# Patient Record
Sex: Female | Born: 1955
Health system: Southern US, Community
[De-identification: ages and names within clinical notes are randomized; demographics above are authoritative.]

## PROBLEM LIST (undated history)

## (undated) DIAGNOSIS — M2550 Pain in unspecified joint: Secondary | ICD-10-CM

## (undated) DIAGNOSIS — R002 Palpitations: Secondary | ICD-10-CM

## (undated) DIAGNOSIS — I10 Essential (primary) hypertension: Secondary | ICD-10-CM

## (undated) DIAGNOSIS — D3130 Benign neoplasm of unspecified choroid: Secondary | ICD-10-CM

## (undated) DIAGNOSIS — R011 Cardiac murmur, unspecified: Secondary | ICD-10-CM

## (undated) DIAGNOSIS — M069 Rheumatoid arthritis, unspecified: Secondary | ICD-10-CM

## (undated) DIAGNOSIS — R5383 Other fatigue: Secondary | ICD-10-CM

## (undated) DIAGNOSIS — E039 Hypothyroidism, unspecified: Secondary | ICD-10-CM

## (undated) DIAGNOSIS — F4320 Adjustment disorder, unspecified: Secondary | ICD-10-CM

## (undated) DIAGNOSIS — G8929 Other chronic pain: Secondary | ICD-10-CM

## (undated) DIAGNOSIS — G4733 Obstructive sleep apnea (adult) (pediatric): Secondary | ICD-10-CM

## (undated) DIAGNOSIS — F329 Major depressive disorder, single episode, unspecified: Secondary | ICD-10-CM

## (undated) DIAGNOSIS — I341 Nonrheumatic mitral (valve) prolapse: Secondary | ICD-10-CM

## (undated) DIAGNOSIS — Z13220 Encounter for screening for lipoid disorders: Secondary | ICD-10-CM

## (undated) HISTORY — DX: Pain in unspecified joint: M25.50

## (undated) HISTORY — DX: Rheumatoid arthritis, unspecified: M06.9

## (undated) HISTORY — PX: CHOLECYSTECTOMY: SHX55

## (undated) HISTORY — DX: Benign neoplasm of unspecified choroid: D31.30

## (undated) HISTORY — DX: Other chronic pain: G89.29

## (undated) HISTORY — DX: Cardiac murmur, unspecified: R01.1

## (undated) HISTORY — DX: Palpitations: R00.2

## (undated) HISTORY — DX: Obstructive sleep apnea (adult) (pediatric): G47.33

## (undated) HISTORY — DX: Essential (primary) hypertension: I10

## (undated) HISTORY — DX: Hypothyroidism, unspecified: E03.9

## (undated) HISTORY — DX: Nonrheumatic mitral (valve) prolapse: I34.1

## (undated) HISTORY — DX: Encounter for screening for lipoid disorders: Z13.220

## (undated) HISTORY — DX: Other fatigue: R53.83

## (undated) HISTORY — DX: Major depressive disorder, single episode, unspecified: F32.9

## (undated) HISTORY — DX: Adjustment disorder, unspecified: F43.20

## (undated) HISTORY — PX: BACK SURGERY: SHX140

---

## 1999-03-22 ENCOUNTER — Encounter: Payer: Self-pay | Admitting: Neurosurgery

## 1999-03-22 ENCOUNTER — Inpatient Hospital Stay (HOSPITAL_COMMUNITY): Admission: RE | Admit: 1999-03-22 | Discharge: 1999-03-23 | Payer: Self-pay | Admitting: Neurosurgery

## 1999-08-20 ENCOUNTER — Encounter: Payer: Self-pay | Admitting: Neurosurgery

## 1999-08-20 ENCOUNTER — Encounter: Admission: RE | Admit: 1999-08-20 | Discharge: 1999-08-20 | Payer: Self-pay | Admitting: Gynecology

## 2010-02-03 ENCOUNTER — Encounter: Payer: Self-pay | Admitting: Family Medicine

## 2011-05-27 DIAGNOSIS — D3130 Benign neoplasm of unspecified choroid: Secondary | ICD-10-CM

## 2011-05-27 HISTORY — DX: Benign neoplasm of unspecified choroid: D31.30

## 2013-08-03 ENCOUNTER — Ambulatory Visit: Payer: Self-pay | Admitting: Podiatrist

## 2014-10-02 DIAGNOSIS — R002 Palpitations: Secondary | ICD-10-CM

## 2014-10-02 HISTORY — DX: Palpitations: R00.2

## 2014-11-30 ENCOUNTER — Other Ambulatory Visit: Payer: Self-pay | Admitting: Internal Medicine

## 2014-11-30 DIAGNOSIS — M549 Dorsalgia, unspecified: Secondary | ICD-10-CM

## 2015-10-13 DIAGNOSIS — G8929 Other chronic pain: Secondary | ICD-10-CM | POA: Insufficient documentation

## 2015-10-13 DIAGNOSIS — M2550 Pain in unspecified joint: Secondary | ICD-10-CM

## 2015-10-13 HISTORY — DX: Other chronic pain: G89.29

## 2015-10-14 DIAGNOSIS — G4733 Obstructive sleep apnea (adult) (pediatric): Secondary | ICD-10-CM

## 2015-10-14 DIAGNOSIS — I341 Nonrheumatic mitral (valve) prolapse: Secondary | ICD-10-CM

## 2015-10-14 DIAGNOSIS — F32A Depression, unspecified: Secondary | ICD-10-CM

## 2015-10-14 DIAGNOSIS — E039 Hypothyroidism, unspecified: Secondary | ICD-10-CM | POA: Insufficient documentation

## 2015-10-14 DIAGNOSIS — F329 Major depressive disorder, single episode, unspecified: Secondary | ICD-10-CM | POA: Insufficient documentation

## 2015-10-14 HISTORY — DX: Hypothyroidism, unspecified: E03.9

## 2015-10-14 HISTORY — DX: Nonrheumatic mitral (valve) prolapse: I34.1

## 2015-10-14 HISTORY — DX: Obstructive sleep apnea (adult) (pediatric): G47.33

## 2015-10-14 HISTORY — DX: Depression, unspecified: F32.A

## 2016-01-17 DIAGNOSIS — R0989 Other specified symptoms and signs involving the circulatory and respiratory systems: Secondary | ICD-10-CM | POA: Diagnosis not present

## 2016-01-17 DIAGNOSIS — R0689 Other abnormalities of breathing: Secondary | ICD-10-CM | POA: Diagnosis not present

## 2016-01-17 DIAGNOSIS — J029 Acute pharyngitis, unspecified: Secondary | ICD-10-CM | POA: Diagnosis not present

## 2016-01-17 DIAGNOSIS — R51 Headache: Secondary | ICD-10-CM | POA: Diagnosis not present

## 2016-01-17 DIAGNOSIS — J22 Unspecified acute lower respiratory infection: Secondary | ICD-10-CM | POA: Diagnosis not present

## 2016-01-17 DIAGNOSIS — R05 Cough: Secondary | ICD-10-CM | POA: Diagnosis not present

## 2016-04-26 DIAGNOSIS — Z23 Encounter for immunization: Secondary | ICD-10-CM | POA: Diagnosis not present

## 2016-04-26 DIAGNOSIS — Z13220 Encounter for screening for lipoid disorders: Secondary | ICD-10-CM | POA: Insufficient documentation

## 2016-04-26 DIAGNOSIS — I10 Essential (primary) hypertension: Secondary | ICD-10-CM

## 2016-04-26 DIAGNOSIS — E039 Hypothyroidism, unspecified: Secondary | ICD-10-CM | POA: Diagnosis not present

## 2016-04-26 DIAGNOSIS — M2550 Pain in unspecified joint: Secondary | ICD-10-CM | POA: Diagnosis not present

## 2016-04-26 DIAGNOSIS — F4320 Adjustment disorder, unspecified: Secondary | ICD-10-CM

## 2016-04-26 DIAGNOSIS — Z0000 Encounter for general adult medical examination without abnormal findings: Secondary | ICD-10-CM | POA: Diagnosis not present

## 2016-04-26 HISTORY — DX: Essential (primary) hypertension: I10

## 2016-04-26 HISTORY — DX: Encounter for screening for lipoid disorders: Z13.220

## 2016-04-26 HISTORY — DX: Adjustment disorder, unspecified: F43.20

## 2016-06-20 DIAGNOSIS — G8929 Other chronic pain: Secondary | ICD-10-CM | POA: Diagnosis not present

## 2016-06-20 DIAGNOSIS — M2550 Pain in unspecified joint: Secondary | ICD-10-CM | POA: Diagnosis not present

## 2016-08-01 DIAGNOSIS — L820 Inflamed seborrheic keratosis: Secondary | ICD-10-CM | POA: Diagnosis not present

## 2016-08-01 DIAGNOSIS — L578 Other skin changes due to chronic exposure to nonionizing radiation: Secondary | ICD-10-CM | POA: Diagnosis not present

## 2016-08-01 DIAGNOSIS — L821 Other seborrheic keratosis: Secondary | ICD-10-CM | POA: Diagnosis not present

## 2016-08-01 DIAGNOSIS — C44629 Squamous cell carcinoma of skin of left upper limb, including shoulder: Secondary | ICD-10-CM | POA: Diagnosis not present

## 2016-09-23 DIAGNOSIS — R011 Cardiac murmur, unspecified: Secondary | ICD-10-CM

## 2016-09-23 DIAGNOSIS — M069 Rheumatoid arthritis, unspecified: Secondary | ICD-10-CM

## 2016-09-23 DIAGNOSIS — R5383 Other fatigue: Secondary | ICD-10-CM

## 2016-09-23 HISTORY — DX: Other fatigue: R53.83

## 2016-09-23 HISTORY — DX: Cardiac murmur, unspecified: R01.1

## 2016-09-23 HISTORY — DX: Rheumatoid arthritis, unspecified: M06.9

## 2016-09-27 ENCOUNTER — Ambulatory Visit: Payer: Self-pay | Admitting: Cardiology

## 2016-10-10 NOTE — Progress Notes (Signed)
Cardiology Office Note:    Date:  10/11/2016   ID:  Shelby Kirk, DOB 1955/12/18, MRN [id number]  PCP: Dr Theda Sers  Cardiologist:  Shirlee More, MD    Referring MD: Commodore He*    ASSESSMENT:    1. Palpitation   2. Essential hypertension   3. MVP (mitral valve prolapse)    PLAN:    In order of problems listed above:  1. Stable continue her current beta blocker 2.          Blood pressure is not at target, I asked her to resume her loop diuretic 2 days a week and monitor home blood pressure 3.          Stable no murmur on exam I don't think she requires repeat cardiac imaging  Next appointment:  months   Medication Adjustments/Labs and Tests Ordered: Current medicines are reviewed at length with the patient today.  Concerns regarding medicines are outlined above.  Orders Placed This Encounter  Procedures  . EKG 12-Lead   Meds ordered this encounter  Medications  . furosemide (LASIX) 20 MG tablet    Sig: Take 1 tablet (20 mg total) by mouth 2 (two) times a week.    Dispense:  30 tablet    Refill:  6    Chief Complaint  Patient presents with  . Follow-up    1 year follow up   . Edema  . Palpitations    History of Present Illness:    Shelby Kirk is a 60 y.o. female with a hx of Palpitations with symptomatic APC's last seen within the last year. She is under intense stress with the death of her mother and depression and has stopped her antidepressant medication. She has episodes at rest and under stress which she is aware of her heart beating but not severe or sustained. When she's checked her heart rate is less than 100 bpm. No complaints of chest pain shortness of breath or syncope.. Compliance with diet, lifestyle and medications: no, she stopped her antihypertensive diureticand antidepressant Past Medical History:  Diagnosis Date  . Adult situational stress disorder 04/26/2016  . Benign neoplasm of choroid 05/27/2011  . Chronic pain of  multiple joints 10/13/2015  . Depression 10/14/2015  . Essential hypertension 04/26/2016  . Fatigue 09/23/2016  . Hypothyroidism 10/14/2015  . Murmur, heart 09/23/2016  . MVP (mitral valve prolapse) 10/14/2015  . OSA (obstructive sleep apnea) 10/14/2015  . Palpitation 10/02/2014  . Rheumatoid arthritis ([place]) 09/23/2016  . Screening cholesterol level 04/26/2016    Past Surgical History:  Procedure Laterality Date  . BACK SURGERY    . CESAREAN SECTION    . CHOLECYSTECTOMY      Current Medications: Current Meds  Medication Sig  . acebutolol (SECTRAL) 200 MG capsule Take 200 mg by mouth 2 (two) times daily.  . cyclobenzaprine (FLEXERIL) 10 MG tablet Take 10 mg by mouth daily as needed for muscle spasms.  Derrill Memo ON 10/14/2016] furosemide (LASIX) 20 MG tablet Take 1 tablet (20 mg total) by mouth 2 (two) times a week.  Marland Kitchen ibuprofen (ADVIL,MOTRIN) 800 MG tablet TAKE ONE TABLET BY MOUTH THREE TIMES DAILY WITH FOOD OR MILK FOR JOINT PAIN  . levothyroxine (SYNTHROID, LEVOTHROID) 112 MCG tablet Take 112 mcg by mouth daily.  Marland Kitchen lisinopril (PRINIVIL,ZESTRIL) 40 MG tablet Take 40 mg by mouth daily.  . [DISCONTINUED] furosemide (LASIX) 20 MG tablet TAKE 2 TABLETS BY MOUTH ONCE PER DAY ONLY AS NEEDED FOR LEG SWELLING  Allergies:   Penicillins and Sulfa antibiotics   Social History   Social History  . Marital status: Married    Spouse name: N/A  . Number of children: N/A  . Years of education: N/A   Social History Main Topics  . Smoking status: Never Smoker  . Smokeless tobacco: Never Used  . Alcohol use No  . Drug use: No  . Sexual activity: Not Asked   Other Topics Concern  . None   Social History Narrative  . None     Family History: The patient's family history includes Bone cancer in her father; CAD in her father and mother; Heart attack in her maternal grandfather and maternal grandmother; Hyperlipidemia in her father and mother; Hypertension in her father and mother; Stroke in  her maternal grandmother. ROS:   Please see the history of present illness.    All other systems reviewed and are negative.  EKGs/Labs/Other Studies Reviewed:    The following studies were reviewed today:  EKG:  EKG ordered today.  The ekg ordered today demonstrates inus rhythm normal  Recent Labs: No results found for requested labs within last 8760 hours.  Recent Lipid Panel No results found for: CHOL, TRIG, HDL, CHOLHDL, VLDL, LDLCALC, LDLDIRECT  Physical Exam:    VS:  BP (!) 136/96 (BP Location: Right Arm, Patient Position: Sitting)   Pulse 60   Ht 5\' 7"  (1.702 m)   Wt 238 lb (108 kg)   SpO2 95%   BMI 37.28 kg/m     Wt Readings from Last 3 Encounters:  10/11/16 238 lb (108 kg)     GEN:  Well nourished, well developed in no acute distress HEENT: Normal NECK: No JVD; No carotid bruits LYMPHATICS: No lymphadenopathy CARDIAC: RRR, no murmurs, rubs, gallops RESPIRATORY:  Clear to auscultation without rales, wheezing or rhonchi  ABDOMEN: Soft, non-tender, non-distended MUSCULOSKELETAL:  No edema; No deformity  SKIN: Warm and dry NEUROLOGIC:  Alert and oriented x 3 PSYCHIATRIC:  Normal affect    Signed, Shirlee More, MD  10/11/2016 12:32 PM    [facility]

## 2016-10-11 ENCOUNTER — Encounter: Payer: Self-pay | Admitting: Cardiology

## 2016-10-11 ENCOUNTER — Ambulatory Visit (INDEPENDENT_AMBULATORY_CARE_PROVIDER_SITE_OTHER): Payer: 59 | Admitting: Cardiology

## 2016-10-11 VITALS — BP 136/96 | HR 60 | Ht 67.0 in | Wt 238.0 lb

## 2016-10-11 DIAGNOSIS — R002 Palpitations: Secondary | ICD-10-CM | POA: Diagnosis not present

## 2016-10-11 DIAGNOSIS — I341 Nonrheumatic mitral (valve) prolapse: Secondary | ICD-10-CM

## 2016-10-11 DIAGNOSIS — I10 Essential (primary) hypertension: Secondary | ICD-10-CM

## 2016-10-11 MED ORDER — FUROSEMIDE 20 MG PO TABS: 20.0000 mg | ORAL_TABLET | ORAL | 6 refills | Status: DC

## 2016-10-11 NOTE — Patient Instructions (Addendum)
Medication Instructions:  Your physician has recommended you make the following change in your medication:  START furosemide (Lasix) 20 mg 2 days a week  Labwork: None  Testing/Procedures: You had an EKG today.  Follow-Up: Your physician wants you to follow-up in: 6 months. You will receive a reminder letter in the mail two months in advance. If you don't receive a letter, please call our office to schedule the follow-up appointment.   Any Other Special Instructions Will Be Listed Below (If Applicable).     If you need a refill on your cardiac medications before your next appointment, please call your pharmacy.    1. Avoid all over-the-counter antihistamines except Claritin/Loratadine and Zyrtec/Cetrizine. 2. Avoid all combination including cold sinus allergies flu decongestant and sleep medications 3. You can use Robitussin DM Mucinex and Mucinex DM for cough. 4. can use Tylenol aspirin ibuprofen and naproxen but no combinations such as sleep or sinus.

## 2016-11-19 ENCOUNTER — Other Ambulatory Visit: Payer: Self-pay

## 2016-11-19 MED ORDER — ACEBUTOLOL HCL 200 MG PO CAPS: 200.0000 mg | ORAL_CAPSULE | Freq: Two times a day (BID) | ORAL | 3 refills | Status: DC

## 2016-11-21 DIAGNOSIS — Z23 Encounter for immunization: Secondary | ICD-10-CM | POA: Diagnosis not present

## 2016-12-11 DIAGNOSIS — M2550 Pain in unspecified joint: Secondary | ICD-10-CM | POA: Diagnosis not present

## 2016-12-11 DIAGNOSIS — G8929 Other chronic pain: Secondary | ICD-10-CM | POA: Diagnosis not present

## 2017-01-24 DIAGNOSIS — M2550 Pain in unspecified joint: Secondary | ICD-10-CM | POA: Diagnosis not present

## 2017-01-24 DIAGNOSIS — G8929 Other chronic pain: Secondary | ICD-10-CM | POA: Diagnosis not present

## 2017-01-31 DIAGNOSIS — B372 Candidiasis of skin and nail: Secondary | ICD-10-CM | POA: Diagnosis not present

## 2017-01-31 DIAGNOSIS — G4730 Sleep apnea, unspecified: Secondary | ICD-10-CM | POA: Diagnosis not present

## 2017-02-20 DIAGNOSIS — Z780 Asymptomatic menopausal state: Secondary | ICD-10-CM | POA: Diagnosis not present

## 2017-03-06 DIAGNOSIS — I10 Essential (primary) hypertension: Secondary | ICD-10-CM | POA: Diagnosis not present

## 2017-03-06 DIAGNOSIS — M79606 Pain in leg, unspecified: Secondary | ICD-10-CM | POA: Diagnosis not present

## 2017-04-25 DIAGNOSIS — G8929 Other chronic pain: Secondary | ICD-10-CM | POA: Diagnosis not present

## 2017-04-25 DIAGNOSIS — M2550 Pain in unspecified joint: Secondary | ICD-10-CM | POA: Diagnosis not present

## 2017-08-19 DIAGNOSIS — M170 Bilateral primary osteoarthritis of knee: Secondary | ICD-10-CM | POA: Diagnosis not present

## 2017-08-19 DIAGNOSIS — G8929 Other chronic pain: Secondary | ICD-10-CM | POA: Diagnosis not present

## 2017-08-19 DIAGNOSIS — M25561 Pain in right knee: Secondary | ICD-10-CM | POA: Diagnosis not present

## 2017-08-19 DIAGNOSIS — M25562 Pain in left knee: Secondary | ICD-10-CM | POA: Diagnosis not present

## 2017-08-22 ENCOUNTER — Other Ambulatory Visit: Payer: Self-pay | Admitting: Orthopedic Surgery

## 2017-08-22 DIAGNOSIS — R531 Weakness: Secondary | ICD-10-CM

## 2017-08-22 DIAGNOSIS — R52 Pain, unspecified: Secondary | ICD-10-CM

## 2017-09-03 DIAGNOSIS — E039 Hypothyroidism, unspecified: Secondary | ICD-10-CM | POA: Diagnosis not present

## 2017-09-03 DIAGNOSIS — Z780 Asymptomatic menopausal state: Secondary | ICD-10-CM | POA: Diagnosis not present

## 2017-09-03 DIAGNOSIS — I10 Essential (primary) hypertension: Secondary | ICD-10-CM | POA: Diagnosis not present

## 2017-09-30 DIAGNOSIS — M461 Sacroiliitis, not elsewhere classified: Secondary | ICD-10-CM | POA: Diagnosis not present

## 2017-09-30 DIAGNOSIS — M542 Cervicalgia: Secondary | ICD-10-CM | POA: Diagnosis not present

## 2017-09-30 DIAGNOSIS — M545 Low back pain: Secondary | ICD-10-CM | POA: Diagnosis not present

## 2017-10-07 DIAGNOSIS — M545 Low back pain: Secondary | ICD-10-CM | POA: Diagnosis not present

## 2017-10-07 DIAGNOSIS — M461 Sacroiliitis, not elsewhere classified: Secondary | ICD-10-CM | POA: Diagnosis not present

## 2017-10-07 DIAGNOSIS — M542 Cervicalgia: Secondary | ICD-10-CM | POA: Diagnosis not present

## 2017-11-13 DIAGNOSIS — Z6837 Body mass index (BMI) 37.0-37.9, adult: Secondary | ICD-10-CM | POA: Diagnosis not present

## 2017-11-13 DIAGNOSIS — Z1159 Encounter for screening for other viral diseases: Secondary | ICD-10-CM | POA: Diagnosis not present

## 2017-11-13 DIAGNOSIS — M1990 Unspecified osteoarthritis, unspecified site: Secondary | ICD-10-CM | POA: Diagnosis not present

## 2017-11-13 DIAGNOSIS — Z23 Encounter for immunization: Secondary | ICD-10-CM | POA: Diagnosis not present

## 2018-02-06 DIAGNOSIS — G4733 Obstructive sleep apnea (adult) (pediatric): Secondary | ICD-10-CM | POA: Diagnosis not present

## 2018-02-09 DIAGNOSIS — M130 Polyarthritis, unspecified: Secondary | ICD-10-CM | POA: Diagnosis not present

## 2018-02-09 DIAGNOSIS — Z6838 Body mass index (BMI) 38.0-38.9, adult: Secondary | ICD-10-CM | POA: Diagnosis not present

## 2018-02-09 DIAGNOSIS — R748 Abnormal levels of other serum enzymes: Secondary | ICD-10-CM | POA: Diagnosis not present

## 2018-02-10 DIAGNOSIS — G4733 Obstructive sleep apnea (adult) (pediatric): Secondary | ICD-10-CM | POA: Diagnosis not present

## 2018-02-16 DIAGNOSIS — K760 Fatty (change of) liver, not elsewhere classified: Secondary | ICD-10-CM | POA: Diagnosis not present

## 2018-02-16 DIAGNOSIS — R748 Abnormal levels of other serum enzymes: Secondary | ICD-10-CM | POA: Diagnosis not present

## 2018-02-23 DIAGNOSIS — G4733 Obstructive sleep apnea (adult) (pediatric): Secondary | ICD-10-CM | POA: Diagnosis not present

## 2018-03-09 DIAGNOSIS — G4733 Obstructive sleep apnea (adult) (pediatric): Secondary | ICD-10-CM | POA: Diagnosis not present

## 2018-04-06 DIAGNOSIS — M1990 Unspecified osteoarthritis, unspecified site: Secondary | ICD-10-CM | POA: Diagnosis not present

## 2018-04-07 DIAGNOSIS — G4733 Obstructive sleep apnea (adult) (pediatric): Secondary | ICD-10-CM | POA: Diagnosis not present

## 2018-05-21 DIAGNOSIS — M542 Cervicalgia: Secondary | ICD-10-CM | POA: Diagnosis not present

## 2018-05-21 DIAGNOSIS — M546 Pain in thoracic spine: Secondary | ICD-10-CM | POA: Diagnosis not present

## 2018-05-21 DIAGNOSIS — M545 Low back pain: Secondary | ICD-10-CM | POA: Diagnosis not present

## 2018-08-25 ENCOUNTER — Other Ambulatory Visit: Payer: Self-pay | Admitting: Cardiology

## 2019-05-06 DIAGNOSIS — R5381 Other malaise: Secondary | ICD-10-CM | POA: Insufficient documentation

## 2019-05-06 DIAGNOSIS — N951 Menopausal and female climacteric states: Secondary | ICD-10-CM

## 2019-05-06 HISTORY — DX: Menopausal and female climacteric states: N95.1

## 2019-05-06 HISTORY — DX: Other malaise: R53.81

## 2019-07-26 ENCOUNTER — Emergency Department (HOSPITAL_BASED_OUTPATIENT_CLINIC_OR_DEPARTMENT_OTHER)
Admission: EM | Admit: 2019-07-26 | Discharge: 2019-07-26 | Disposition: A | Discharge status: Home / Self Care | Payer: 59 | Attending: Emergency Medicine | Admitting: Emergency Medicine

## 2019-07-26 ENCOUNTER — Other Ambulatory Visit: Payer: Self-pay

## 2019-07-26 ENCOUNTER — Emergency Department (HOSPITAL_BASED_OUTPATIENT_CLINIC_OR_DEPARTMENT_OTHER): Payer: 59

## 2019-07-26 ENCOUNTER — Encounter (HOSPITAL_BASED_OUTPATIENT_CLINIC_OR_DEPARTMENT_OTHER): Payer: Self-pay | Admitting: Emergency Medicine

## 2019-07-26 DIAGNOSIS — Z79899 Other long term (current) drug therapy: Secondary | ICD-10-CM | POA: Insufficient documentation

## 2019-07-26 DIAGNOSIS — E039 Hypothyroidism, unspecified: Secondary | ICD-10-CM | POA: Insufficient documentation

## 2019-07-26 DIAGNOSIS — R002 Palpitations: Secondary | ICD-10-CM | POA: Diagnosis not present

## 2019-07-26 DIAGNOSIS — I10 Essential (primary) hypertension: Secondary | ICD-10-CM | POA: Diagnosis not present

## 2019-07-26 LAB — TROPONIN I (HIGH SENSITIVITY): Troponin I (High Sensitivity): 4 ng/L (ref <18)

## 2019-07-26 LAB — CBC WITH DIFFERENTIAL/PLATELET
Abs Immature Granulocytes: 0.03 10*3/uL (ref 0.00–0.07)
Basophils Absolute: 0 10*3/uL (ref 0.0–0.1)
Basophils Relative: 0 %
Eosinophils Absolute: 0.1 10*3/uL (ref 0.0–0.5)
Eosinophils Relative: 1 %
HCT: 52.8 % — ABNORMAL HIGH (ref 36.0–46.0)
Hemoglobin: 17.6 g/dL — ABNORMAL HIGH (ref 12.0–15.0)
Immature Granulocytes: 0 %
Lymphocytes Relative: 12 %
Lymphs Abs: 1.1 10*3/uL (ref 0.7–4.0)
MCH: 31.3 pg (ref 26.0–34.0)
MCHC: 33.3 g/dL (ref 30.0–36.0)
MCV: 94 fL (ref 80.0–100.0)
Monocytes Absolute: 0.5 10*3/uL (ref 0.1–1.0)
Monocytes Relative: 6 %
Neutro Abs: 7.8 10*3/uL — ABNORMAL HIGH (ref 1.7–7.7)
Neutrophils Relative %: 81 %
Platelets: 227 10*3/uL (ref 150–400)
RBC: 5.62 MIL/uL — ABNORMAL HIGH (ref 3.87–5.11)
RDW: 12.9 % (ref 11.5–15.5)
WBC: 9.7 10*3/uL (ref 4.0–10.5)
nRBC: 0 % (ref 0.0–0.2)

## 2019-07-26 LAB — BASIC METABOLIC PANEL
Anion gap: 13 (ref 5–15)
BUN: 14 mg/dL (ref 8–23)
CO2: 26 mmol/L (ref 22–32)
Calcium: 9.5 mg/dL (ref 8.9–10.3)
Chloride: 103 mmol/L (ref 98–111)
Creatinine, Ser: 0.84 mg/dL (ref 0.44–1.00)
GFR calc Af Amer: >60 mL/min (ref >60)
GFR calc non Af Amer: >60 mL/min (ref >60)
Glucose, Bld: 106 mg/dL — ABNORMAL HIGH (ref 70–99)
Potassium: 4.2 mmol/L (ref 3.5–5.1)
Sodium: 142 mmol/L (ref 135–145)

## 2019-07-26 LAB — MAGNESIUM: Magnesium: 2.1 mg/dL (ref 1.7–2.4)

## 2019-07-26 LAB — D-DIMER, QUANTITATIVE: D-Dimer, Quant: 0.34 ug/mL-FEU (ref 0.00–0.50)

## 2019-07-26 MED ORDER — ACEBUTOLOL HCL 200 MG PO CAPS: 200.0000 mg | ORAL_CAPSULE | Freq: Every day | ORAL | 0 refills | Status: DC

## 2019-07-26 NOTE — ED Notes (Signed)
ED Provider at bedside discussing test results and plan of care. 

## 2019-07-26 NOTE — ED Provider Notes (Signed)
[place] EMERGENCY DEPARTMENT Provider Note   CSN: [id number] Arrival date & time: 07/26/19  1040     History Chief Complaint  Patient presents with  . Hypertension    Shelby Kirk is a 64 y.o. female.  HPI   Patient presents to the emergency department with with chief complaint of palpitations that have started on Friday.  She explains palpitations come on randomly generally when she is resting and go away on their own.  She denies chest pain, shortness of breath, becoming diaphoretic, nausea, vomiting, radiating pain.  She explains she was recently started on Adipex a weight loss medication about 3 weeks ago and has felt different since being on it and feels this is what caused her heart to race.  Patient denies alleviating or aggravating factors.  Patient admitts that she has hypothyroidism and is on levothyroxine and states that her last primary care provider check her  TSH  was slightly elevated 4.33.  She also admits that she struggles with anxiety and has been stressed out between work and home life.  She has cardiac risk factors of hypertension, OSA, was on a estrogen replacement therapy but stopped, mitral valve prolapse, rapid heart rate which she takes ACEbutolol, denies smoking, IV drug use, leg pain, leg swelling, recent surgeries or long immobilizations.  Patient denies headache, fever, chills, sore throat, cough, chest pain, shortness of breath, nausea, vomiting, diarrhea, dysuria, pedal edema, pedal swelling.  Past Medical History:  Diagnosis Date  . Adult situational stress disorder 04/26/2016  . Benign neoplasm of choroid 05/27/2011  . Chronic pain of multiple joints 10/13/2015  . Depression 10/14/2015  . Essential hypertension 04/26/2016  . Fatigue 09/23/2016  . Hypothyroidism 10/14/2015  . Murmur, heart 09/23/2016  . MVP (mitral valve prolapse) 10/14/2015  . OSA (obstructive sleep apnea) 10/14/2015  . Palpitation 10/02/2014  . Rheumatoid arthritis ([place])  09/23/2016  . Screening cholesterol level 04/26/2016    Patient Active Problem List   Diagnosis Date Noted  . Rheumatoid arthritis ([place]) 09/23/2016  . Fatigue 09/23/2016  . Murmur, heart 09/23/2016  . Adult situational stress disorder 04/26/2016  . Essential hypertension 04/26/2016  . Screening cholesterol level 04/26/2016  . Depression 10/14/2015  . Hypothyroidism 10/14/2015  . MVP (mitral valve prolapse) 10/14/2015  . OSA (obstructive sleep apnea) 10/14/2015  . Chronic pain of multiple joints 10/13/2015  . Palpitation 10/02/2014  . Benign neoplasm of choroid 05/27/2011    Past Surgical History:  Procedure Laterality Date  . BACK SURGERY    . CESAREAN SECTION    . CHOLECYSTECTOMY       OB History   No obstetric history on file.     Family History  Problem Relation Age of Onset  . Hypertension Mother   . Hyperlipidemia Mother   . CAD Mother   . Hypertension Father   . Hyperlipidemia Father   . Bone cancer Father   . CAD Father   . Stroke Maternal Grandmother   . Heart attack Maternal Grandmother   . Heart attack Maternal Grandfather     Social History   Tobacco Use  . Smoking status: Never Smoker  . Smokeless tobacco: Never Used  Vaping Use  . Vaping Use: Never used  Substance Use Topics  . Alcohol use: No  . Drug use: No    Home Medications Prior to Admission medications   Medication Sig Start Date End Date Taking? Authorizing Provider  acebutolol (SECTRAL) 200 MG capsule Take 1 capsule (200 mg total)  by mouth 2 (two) times daily. NEEDS OFFICE VISIT FOR FURTHER REFILLS 08/28/18  Yes Richardo Priest, MD  collagenase (SANTYL) ointment Apply 1 application topically daily.   Yes [provider]  levothyroxine (SYNTHROID, LEVOTHROID) 112 MCG tablet Take 112 mcg by mouth daily. 10/10/15  Yes [provider]  lisinopril (PRINIVIL,ZESTRIL) 40 MG tablet Take 40 mg by mouth daily. 08/21/16  Yes [provider]  acebutolol (SECTRAL) 200 MG  capsule Take 1 capsule (200 mg total) by mouth daily. 07/26/19   Marcello Fennel, PA-C  cyclobenzaprine (FLEXERIL) 10 MG tablet Take 10 mg by mouth daily as needed for muscle spasms. 08/23/16   [provider]  furosemide (LASIX) 20 MG tablet Take 1 tablet (20 mg total) by mouth 2 (two) times a week. 10/14/16   Richardo Priest, MD  ibuprofen (ADVIL,MOTRIN) 800 MG tablet TAKE ONE TABLET BY MOUTH THREE TIMES DAILY WITH FOOD OR MILK FOR JOINT PAIN 09/14/15   [provider]    Allergies    Penicillins and Sulfa antibiotics  Review of Systems   Review of Systems  Constitutional: Negative for chills and fever.  HENT: Negative for congestion, tinnitus and trouble swallowing.   Eyes: Negative for redness.  Respiratory: Negative for cough, chest tightness and shortness of breath.   Cardiovascular: Positive for palpitations. Negative for chest pain and leg swelling.  Gastrointestinal: Negative for abdominal pain, diarrhea, nausea and vomiting.  Genitourinary: Negative for enuresis.  Musculoskeletal: Negative for back pain.  Skin: Negative for rash.  Neurological: Negative for dizziness, facial asymmetry and headaches.  Hematological: Does not bruise/bleed easily.    Physical Exam Updated Vital Signs BP (!) 159/100 (BP Location: Left Arm)   Pulse 78   Temp 98.1 F (36.7 C) (Oral)   Resp 16   Ht 5\' 7"  (1.702 m)   Wt 108.9 kg   SpO2 99%   BMI 37.59 kg/m   Physical Exam Vitals and nursing note reviewed.  Constitutional:      General: She is not in acute distress.    Appearance: She is not ill-appearing.  HENT:     Head: Normocephalic and atraumatic.     Nose: No congestion.     Mouth/Throat:     Mouth: Mucous membranes are moist.     Pharynx: Oropharynx is clear.  Eyes:     General: No scleral icterus.    Pupils: Pupils are equal, round, and reactive to light.  Cardiovascular:     Rate and Rhythm: Tachycardia present. Rhythm irregular.     Pulses: Normal  pulses.     Heart sounds: Normal heart sounds. No murmur heard.  No friction rub. No gallop.   Pulmonary:     Effort: No respiratory distress.     Breath sounds: No stridor. No wheezing, rhonchi or rales.  Chest:     Chest wall: No tenderness.  Abdominal:     General: There is no distension.     Palpations: Abdomen is soft. There is no mass.     Tenderness: There is no abdominal tenderness. There is no guarding.  Musculoskeletal:        General: No swelling.  Skin:    General: Skin is warm and dry.     Capillary Refill: Capillary refill takes less than 2 seconds.     Findings: No rash.  Neurological:     Mental Status: She is alert and oriented to person, place, and time.  Psychiatric:  Mood and Affect: Mood normal.     ED Results / Procedures / Treatments   Labs (all labs ordered are listed, but only abnormal results are displayed) Labs Reviewed  CBC WITH DIFFERENTIAL/PLATELET - Abnormal; Notable for the following components:      Result Value   RBC 5.62 (*)    Hemoglobin 17.6 (*)    HCT 52.8 (*)    Neutro Abs 7.8 (*)    All other components within normal limits  BASIC METABOLIC PANEL - Abnormal; Notable for the following components:   Glucose, Bld 106 (*)    All other components within normal limits  MAGNESIUM  D-DIMER, QUANTITATIVE (NOT AT [facility])  TROPONIN I (HIGH SENSITIVITY)    EKG EKG Interpretation  Date/Time:  Monday July 26 2019 16:29:46 EDT Ventricular Rate:  98 PR Interval:    QRS Duration: 87 QT Interval:  333 QTC Calculation: 426 R Axis:   24 Text Interpretation: Atrial fibrillation unchanged since previous Confirmed by Wandra Arthurs [phone]) on 07/26/2019 4:46:55 PM   Radiology DG Chest 2 View  Result Date: 07/26/2019 CLINICAL DATA:  Palpitations EXAM: CHEST - 2 VIEW COMPARISON:  None. FINDINGS: The heart size and mediastinal contours are within normal limits. Both lungs are clear. The visualized skeletal structures are unremarkable.  IMPRESSION: No active cardiopulmonary disease. Electronically Signed   By: Prudencio Pair M.D.   On: 07/26/2019 16:24    Procedures Procedures (including critical care time)  Medications Ordered in ED Medications - No data to display  ED Course  I have reviewed the triage vital signs and the nursing notes.  Pertinent labs & imaging results that were available during my care of the patient were reviewed by me and considered in my medical decision making (see chart for details).    MDM Rules/Calculators/A&P                          I have personally reviewed all imaging, labs and have interpreted them.  Due to patient presentation most concern for MI versus PE versus A. fib versus metabolic abnormality.  Unlikely patient suffering from MI as patient denies chest pain, shortness of breath, diaphoresis, nausea, vomiting, paresthesias, physical exam was benign, EKG was A. fib without signs of ischemia.  Unlikely patient suffering from PE as she denies pleuritic chest pain, shortness of breath, vital signs were reassuring, nontachycardic, nontachypneic, satting at 100% room air, no pedal edema, D-dimer was 0.34.  Patient's CHA2DS2-VASc score was 1 will place her on 81 mg of aspirin daily and recommend that she follows up with her cardiologist for further evaluation.  Unlikely patient suffering from congestive heart failure as chest x-ray does not show any acute abnormalities no edema, infiltrates, pneumothorax, or any mediastinum.  Unlikely patient suffering from metabolic abnormality as BMP does not show any electrolyte abnormalities, magnesium was normal, no signs of AKI.  No signs of systemic infection as patient was nontoxic, vital signs reassuring, CBC did not show signs of leukocytosis.  Patient appears to be resting comfortably in bed, show no acute signs distress.  Vital signs have remained stable does not meet criteria to be admitted to the hospital.  Likely patient suffering from palpitations  possibly secondary to A. fib, but could be caused from anxiety, medication induced (levothyroxine, phentermine).  recommend patient stop taking phentermine, limit her caffeine consumption, increased her beta-blocker, start her on 81 mg of aspirin and  follow-up with cardiologist for further evaluation management.  Patient was discussed with attending who agrees assessment and plan.  Patient was given at home care as well as strict return precautions.  Patient verbalized that she understood and agrees to plan. Final Clinical Impression(s) / ED Diagnoses Final diagnoses:  Hypertension, unspecified type  Palpitation    Rx / DC Orders ED Discharge Orders         Ordered    acebutolol (SECTRAL) 200 MG capsule  Daily     Discontinue  Reprint     07/26/19 1752           Marcello Fennel, PA-C 07/26/19 1816    Drenda Freeze, MD 07/27/19 1504

## 2019-07-26 NOTE — ED Triage Notes (Addendum)
Hx of HTN. Reports she has felt palpitations today and checked her BP and found it to be high. Denies pain. She was started on a new weight loss medication Friday.

## 2019-07-26 NOTE — Discharge Instructions (Signed)
You have been seen here for palpitations, lab work and imaging all look reassuring.  I increased your beta-blocker acebutolol please take 1 a day at lunchtime.  Please take the acebutolol that was prescribed to you by your primary care doctor as prescribed in the morning and at night.  So in summary you should take this medication 3 times a day, once in the morning once at lunch and once in the evening.  I also want you to start taking 81 mg of aspirin daily until you were seen by your cardiologist.  I need you to limit your caffeine consumption and stop taking your diet pill as this can increase heart palpitations.  I want you to schedule an appointment with your cardiologist tomorrow.  I want to come back to the emergency department if you develop shortness of breath, chest pain, numbness or tingling in your extremities, change in your vision, loss of balance, uncontrolled nausea, vomiting, diarrhea as the symptoms require further evaluation.

## 2019-07-27 ENCOUNTER — Ambulatory Visit: Payer: 59 | Admitting: Cardiology

## 2019-07-27 ENCOUNTER — Encounter: Payer: Self-pay | Admitting: Cardiology

## 2019-07-27 VITALS — BP 145/98 | HR 94 | Ht 67.0 in | Wt 236.0 lb

## 2019-07-27 DIAGNOSIS — I10 Essential (primary) hypertension: Secondary | ICD-10-CM

## 2019-07-27 DIAGNOSIS — E669 Obesity, unspecified: Secondary | ICD-10-CM | POA: Diagnosis not present

## 2019-07-27 DIAGNOSIS — I480 Paroxysmal atrial fibrillation: Secondary | ICD-10-CM

## 2019-07-27 MED ORDER — DILTIAZEM HCL ER COATED BEADS 180 MG PO CP24
180.0000 mg | ORAL_CAPSULE | Freq: Every day | ORAL | 3 refills | Status: DC
Start: 2019-07-27 — End: 2019-12-02

## 2019-07-27 MED ORDER — APIXABAN 5 MG PO TABS: 5.0000 mg | ORAL_TABLET | Freq: Two times a day (BID) | ORAL | 4 refills | Status: DC

## 2019-07-27 NOTE — Patient Instructions (Addendum)
Medication Instructions: 1) Start Cardizem 180 mg daily   2) Start Eliquis 5 mg twice a day   3) Stop Acebutolol   4) DO NOT start Aspirin   *If you need a refill on your cardiac medications before your next appointment, please call your pharmacy*   Lab Work: None ordered   If you have labs (blood work) drawn today and your tests are completely normal, you will receive your results only by: Marland Kitchen MyChart Message (if you have MyChart) OR . A paper copy in the mail If you have any lab test that is abnormal or we need to change your treatment, we will call you to review the results.   Testing/Procedures: Your physician has requested that you have an echocardiogram. Echocardiography is a painless test that uses sound waves to create images of your heart. It provides your doctor with information about the size and shape of your heart and how well your heart's chambers and valves are working. This procedure takes approximately one hour. There are no restrictions for this procedure.     Follow-Up: At [facility], you and your health needs are our priority.  As part of our continuing mission to provide you with exceptional heart care, we have created designated Provider Care Teams.  These Care Teams include your primary Cardiologist (physician) and Advanced Practice Providers (APPs -  Physician Assistants and Nurse Practitioners) who all work together to provide you with the care you need, when you need it.  We recommend signing up for the patient portal called "MyChart".  Sign up information is provided on this After Visit Summary.  MyChart is used to connect with patients for Virtual Visits (Telemedicine).  Patients are able to view lab/test results, encounter notes, upcoming appointments, etc.  Non-urgent messages can be sent to your provider as well.   To learn more about what you can do with MyChart, go to [email or web address].    Your next appointment:   1 month(s)  The format for  your next appointment:   In Person  Provider:   Berniece Salines, DO   Other Instructions None

## 2019-07-27 NOTE — Progress Notes (Signed)
Cardiology Office Note:    Date:  07/27/2019   ID:  Shelby Kirk, DOB 04/07/1955, MRN [id number]  PCP:  Mayer Camel, NP  Cardiologist:  Berniece Salines, DO  Electrophysiologist:  None   Referring MD: Venetie He*   " I am doing fine now"   History of Present Illness:    Shelby Kirk is a 64 y.o. female with a hx of palpitations, symptomatic PACs previous started on Acebutolol MVP,  Hypertension , OSA and presents today to be evaluated. The patient was last seen by Dr. Bettina Gavia in 2018.   The patient was seen at the [facility] emergency department for evaluated for elevated blood pressure.  During her evaluation she was noted to be in atrial fibrillation. She was asked to see cardiology.   She tells me that she has had intermittent palpitations and some shortness of breath. She denies chest pain. Of note she reported that she recently started a new diet pill which made her feel sick for a couple of days.   Past Medical History:  Diagnosis Date  . Adult situational stress disorder 04/26/2016  . Benign neoplasm of choroid 05/27/2011  . Chronic pain of multiple joints 10/13/2015  . Depression 10/14/2015  . Essential hypertension 04/26/2016  . Fatigue 09/23/2016  . Hypothyroidism 10/14/2015  . Murmur, heart 09/23/2016  . MVP (mitral valve prolapse) 10/14/2015  . OSA (obstructive sleep apnea) 10/14/2015  . Palpitation 10/02/2014  . Rheumatoid arthritis ([place]) 09/23/2016  . Screening cholesterol level 04/26/2016    Past Surgical History:  Procedure Laterality Date  . BACK SURGERY    . CESAREAN SECTION    . CHOLECYSTECTOMY      Current Medications: Current Meds  Medication Sig  . collagenase (SANTYL) ointment Apply 1 application topically daily.  . cyclobenzaprine (FLEXERIL) 10 MG tablet Take 10 mg by mouth daily as needed for muscle spasms.  Marland Kitchen ibuprofen (ADVIL,MOTRIN) 800 MG tablet TAKE ONE TABLET BY MOUTH THREE TIMES DAILY WITH FOOD OR MILK FOR  JOINT PAIN  . levothyroxine (SYNTHROID, LEVOTHROID) 112 MCG tablet Take 112 mcg by mouth daily.  Marland Kitchen lisinopril (PRINIVIL,ZESTRIL) 40 MG tablet Take 40 mg by mouth daily.  . [DISCONTINUED] acebutolol (SECTRAL) 200 MG capsule Take 1 capsule (200 mg total) by mouth 2 (two) times daily. NEEDS OFFICE VISIT FOR FURTHER REFILLS  . [DISCONTINUED] acebutolol (SECTRAL) 200 MG capsule Take 1 capsule (200 mg total) by mouth daily.  . [DISCONTINUED] acebutolol (SECTRAL) 200 MG capsule Take 200 mg by mouth in the morning, at noon, and at bedtime.  . [DISCONTINUED] aspirin EC 81 MG tablet Take 81 mg by mouth daily. Swallow whole.     Allergies:   Penicillins and Sulfa antibiotics   Social History   Socioeconomic History  . Marital status: Married    Spouse name: Not on file  . Number of children: Not on file  . Years of education: Not on file  . Highest education level: Not on file  Occupational History  . Not on file  Tobacco Use  . Smoking status: Never Smoker  . Smokeless tobacco: Never Used  Vaping Use  . Vaping Use: Never used  Substance and Sexual Activity  . Alcohol use: No  . Drug use: No  . Sexual activity: Not on file  Other Topics Concern  . Not on file  Social History Narrative  . Not on file   Social Determinants of Health   Financial Resource Strain:   .  Difficulty of Paying Living Expenses:   Food Insecurity:   . Worried About Charity fundraiser in the Last Year:   . Arboriculturist in the Last Year:   Transportation Needs:   . Film/video editor (Medical):   Marland Kitchen Lack of Transportation (Non-Medical):   Physical Activity:   . Days of Exercise per Week:   . Minutes of Exercise per Session:   Stress:   . Feeling of Stress :   Social Connections:   . Frequency of Communication with Friends and Family:   . Frequency of Social Gatherings with Friends and Family:   . Attends Religious Services:   . Active Member of Clubs or Organizations:   . Attends Theatre manager Meetings:   Marland Kitchen Marital Status:      Family History: The patient's family history includes Bone cancer in her father; CAD in her father and mother; Heart attack in her maternal grandfather and maternal grandmother; Hyperlipidemia in her father and mother; Hypertension in her father and mother; Stroke in her maternal grandmother.  ROS:   Review of Systems  Constitution: Negative for decreased appetite, fever and weight gain.  HENT: Negative for congestion, ear discharge, hoarse voice and sore throat.   Eyes: Negative for discharge, redness, vision loss in right eye and visual halos.  Cardiovascular: Negative for chest pain, dyspnea on exertion, leg swelling, orthopnea and palpitations.  Respiratory: Negative for cough, hemoptysis, shortness of breath and snoring.   Endocrine: Negative for heat intolerance and polyphagia.  Hematologic/Lymphatic: Negative for bleeding problem. Does not bruise/bleed easily.  Skin: Negative for flushing, nail changes, rash and suspicious lesions.  Musculoskeletal: Negative for arthritis, joint pain, muscle cramps, myalgias, neck pain and stiffness.  Gastrointestinal: Negative for abdominal pain, bowel incontinence, diarrhea and excessive appetite.  Genitourinary: Negative for decreased libido, genital sores and incomplete emptying.  Neurological: Negative for brief paralysis, focal weakness, headaches and loss of balance.  Psychiatric/Behavioral: Negative for altered mental status, depression and suicidal ideas.  Allergic/Immunologic: Negative for HIV exposure and persistent infections.    EKGs/Labs/Other Studies Reviewed:    The following studies were reviewed today:   EKG:  The ekg ordered today demonstrates atrial fibrillation with controlled ventricular rate, HR 92 bpm.  Recent Labs: 07/26/2019: BUN 14; Creatinine, Ser 0.84; Hemoglobin 17.6; Magnesium 2.1; Platelets 227; Potassium 4.2; Sodium 142  Recent Lipid Panel No results found for:  CHOL, TRIG, HDL, CHOLHDL, VLDL, LDLCALC, LDLDIRECT  Physical Exam:    VS:  BP (!) 145/98   Pulse 94   Ht 5\' 7"  (1.702 m)   Wt 236 lb (107 kg)   SpO2 98%   BMI 36.96 kg/m     Wt Readings from Last 3 Encounters:  07/27/19 236 lb (107 kg)  07/26/19 240 lb (108.9 kg)  10/11/16 238 lb (108 kg)     GEN: Well nourished, well developed in no acute distress HEENT: Normal NECK: No JVD; No carotid bruits LYMPHATICS: No lymphadenopathy CARDIAC: S1S2 noted,RRR, no murmurs, rubs, gallops RESPIRATORY:  Clear to auscultation without rales, wheezing or rhonchi  ABDOMEN: Soft, non-tender, non-distended, +bowel sounds, no guarding. EXTREMITIES: No edema, No cyanosis, no clubbing MUSCULOSKELETAL:  No deformity  SKIN: Warm and dry NEUROLOGIC:  Alert and oriented x 3, non-focal PSYCHIATRIC:  Normal affect, good insight  ASSESSMENT:    1. Paroxysmal A-fib (HCC)   2. Obesity (BMI 30-39.9)   3. Essential hypertension    PLAN:    She is in atrial fibrillation today.  We discuss management of her afib for rate and rhythm control. Today I will the start the patient on Cardizem 180 mg daily as she is also hypertensive. With this I will stop her acebutolol.I am hoping that she will be able to spontaneously convert to sinus rhythm. In the meantime if she does not convert, I have started the patient on Eliquis 5 mg BID for possible DCCV. A TTE will ordered to assess LV function and any other valvular abnormalities.  She was hypertensive in the office today. Her blood pressure target is less than 130/80 mmHg. I am hoping that she will get some improvement in her blood pressure.  The patient is in agreement with the above plan. The patient left the office in stable condition.  The patient will follow up in 1 month or sooner if needed.   Medication Adjustments/Labs and Tests Ordered: Current medicines are reviewed at length with the patient today.  Concerns regarding medicines are outlined above.  Orders  Placed This Encounter  Procedures  . ECHOCARDIOGRAM COMPLETE   Meds ordered this encounter  Medications  . apixaban (ELIQUIS) 5 MG TABS tablet    Sig: Take 1 tablet (5 mg total) by mouth 2 (two) times daily.    Dispense:  60 tablet    Refill:  4  . diltiazem (CARDIZEM CD) 180 MG 24 hr capsule    Sig: Take 1 capsule (180 mg total) by mouth daily.    Dispense:  90 capsule    Refill:  3    Patient Instructions  Medication Instructions: 1) Start Cardizem 180 mg daily   2) Start Eliquis 5 mg twice a day   3) Stop Acebutolol   4) DO NOT start Aspirin   *If you need a refill on your cardiac medications before your next appointment, please call your pharmacy*   Lab Work: None ordered   If you have labs (blood work) drawn today and your tests are completely normal, you will receive your results only by: Marland Kitchen MyChart Message (if you have MyChart) OR . A paper copy in the mail If you have any lab test that is abnormal or we need to change your treatment, we will call you to review the results.   Testing/Procedures: Your physician has requested that you have an echocardiogram. Echocardiography is a painless test that uses sound waves to create images of your heart. It provides your doctor with information about the size and shape of your heart and how well your heart's chambers and valves are working. This procedure takes approximately one hour. There are no restrictions for this procedure.     Follow-Up: At [facility], you and your health needs are our priority.  As part of our continuing mission to provide you with exceptional heart care, we have created designated Provider Care Teams.  These Care Teams include your primary Cardiologist (physician) and Advanced Practice Providers (APPs -  Physician Assistants and Nurse Practitioners) who all work together to provide you with the care you need, when you need it.  We recommend signing up for the patient portal called "MyChart".   Sign up information is provided on this After Visit Summary.  MyChart is used to connect with patients for Virtual Visits (Telemedicine).  Patients are able to view lab/test results, encounter notes, upcoming appointments, etc.  Non-urgent messages can be sent to your provider as well.   To learn more about what you can do with MyChart, go to [email or web address].    Your next  appointment:   1 month(s)  The format for your next appointment:   In Person  Provider:   Berniece Salines, DO   Other Instructions None      Adopting a Healthy Lifestyle.  Know what a healthy weight is for you (roughly BMI <25) and aim to maintain this   Aim for 7+ servings of fruits and vegetables daily   65-80+ fluid ounces of water or unsweet tea for healthy kidneys   Limit to max 1 drink of alcohol per day; avoid smoking/tobacco   Limit animal fats in diet for cholesterol and heart health - choose grass fed whenever available   Avoid highly processed foods, and foods high in saturated/trans fats   Aim for low stress - take time to unwind and care for your mental health   Aim for 150 min of moderate intensity exercise weekly for heart health, and weights twice weekly for bone health   Aim for 7-9 hours of sleep daily   When it comes to diets, agreement about the perfect plan isnt easy to find, even among the experts. Experts at the [place] developed an idea known as the Healthy Eating Plate. Just imagine a plate divided into logical, healthy portions.   The emphasis is on diet quality:   Load up on vegetables and fruits - one-half of your plate: Aim for color and variety, and remember that potatoes dont count.   Go for whole grains - one-quarter of your plate: Whole wheat, barley, wheat berries, quinoa, oats, brown rice, and foods made with them. If you want pasta, go with whole wheat pasta.   Protein power - one-quarter of your plate: Fish, chicken, beans, and nuts are  all healthy, versatile protein sources. Limit red meat.   The diet, however, does go beyond the plate, offering a few other suggestions.   Use healthy plant oils, such as olive, canola, soy, corn, sunflower and peanut. Check the labels, and avoid partially hydrogenated oil, which have unhealthy trans fats.   If youre thirsty, drink water. Coffee and tea are good in moderation, but skip sugary drinks and limit milk and dairy products to one or two daily servings.   The type of carbohydrate in the diet is more important than the amount. Some sources of carbohydrates, such as vegetables, fruits, whole grains, and beans-are healthier than others.   Finally, stay active  Signed, Berniece Salines, DO  07/27/2019 7:40 PM    [facility]

## 2019-07-29 NOTE — Addendum Note (Signed)
Addended by: Truddie Hidden on: 07/29/2019 08:06 AM   Modules accepted: Orders

## 2019-08-17 ENCOUNTER — Other Ambulatory Visit: Payer: Self-pay

## 2019-08-17 ENCOUNTER — Ambulatory Visit (INDEPENDENT_AMBULATORY_CARE_PROVIDER_SITE_OTHER): Payer: 59

## 2019-08-17 DIAGNOSIS — I480 Paroxysmal atrial fibrillation: Secondary | ICD-10-CM

## 2019-08-17 LAB — ECHOCARDIOGRAM COMPLETE
Area-P 1/2: 5.75 cm2
S' Lateral: 2.9 cm

## 2019-08-17 NOTE — Progress Notes (Signed)
Complete echocardiogram performed.  Jimmy Damiah Mcdonald RDCS, RVT  

## 2019-08-18 ENCOUNTER — Telehealth: Payer: Self-pay

## 2019-08-18 NOTE — Telephone Encounter (Signed)
Patient returned call, she gets off work at [employer] today.

## 2019-08-18 NOTE — Telephone Encounter (Signed)
-----   Message from Berniece Salines, DO sent at 08/17/2019  5:58 PM EDT ----- There is great news your patient fraction is normal. There is mild thickening of your heart walls which could be in the setting of your hypertension. But no abnormalities in any of the heart valves.

## 2019-08-18 NOTE — Telephone Encounter (Signed)
Left message on patients voicemail to please return our call.   

## 2019-08-18 NOTE — Telephone Encounter (Signed)
I spoke with patient and reviewed echo results with her 

## 2019-08-18 NOTE — Telephone Encounter (Signed)
Follow up   Pt is returning call    

## 2019-08-19 ENCOUNTER — Telehealth: Payer: Self-pay

## 2019-08-19 NOTE — Telephone Encounter (Signed)
Left message on patients voicemail to please return our call.   

## 2019-08-19 NOTE — Telephone Encounter (Signed)
-----   Message from Berniece Salines, DO sent at 08/17/2019  5:58 PM EDT ----- There is great news your patient fraction is normal. There is mild thickening of your heart walls which could be in the setting of your hypertension. But no abnormalities in any of the heart valves.

## 2019-08-20 ENCOUNTER — Telehealth: Payer: Self-pay

## 2019-08-20 NOTE — Telephone Encounter (Signed)
-----   Message from Berniece Salines, DO sent at 08/17/2019  5:58 PM EDT ----- There is great news your patient fraction is normal. There is mild thickening of your heart walls which could be in the setting of your hypertension. But no abnormalities in any of the heart valves.

## 2019-08-20 NOTE — Telephone Encounter (Signed)
Left message on patients voicemail to please return our call.   

## 2019-09-08 ENCOUNTER — Ambulatory Visit: Payer: 59 | Admitting: Cardiology

## 2019-09-08 ENCOUNTER — Encounter: Payer: Self-pay | Admitting: Cardiology

## 2019-09-08 ENCOUNTER — Other Ambulatory Visit: Payer: Self-pay

## 2019-09-08 VITALS — BP 128/66 | HR 93 | Ht 67.0 in | Wt 236.2 lb

## 2019-09-08 DIAGNOSIS — I110 Hypertensive heart disease with heart failure: Secondary | ICD-10-CM | POA: Diagnosis not present

## 2019-09-08 DIAGNOSIS — Z7901 Long term (current) use of anticoagulants: Secondary | ICD-10-CM

## 2019-09-08 DIAGNOSIS — I4819 Other persistent atrial fibrillation: Secondary | ICD-10-CM | POA: Diagnosis not present

## 2019-09-08 MED ORDER — FLECAINIDE ACETATE 50 MG PO TABS
50.0000 mg | ORAL_TABLET | Freq: Two times a day (BID) | ORAL | 3 refills | Status: DC
Start: 2019-09-08 — End: 2019-09-16

## 2019-09-08 NOTE — Patient Instructions (Addendum)
Medication Instructions:  Your physician has recommended you make the following change in your medication: START: Flecainide 50 mg take one tablet by mouth twice daily.   *If you need a refill on your cardiac medications before your next appointment, please call your pharmacy*   Lab Work: Your physician recommends that you return for lab work in: TODAY TSH, T3, T4, ProBNP If you have labs (blood work) drawn today and your tests are completely normal, you will receive your results only by: Marland Kitchen MyChart Message (if you have MyChart) OR . A paper copy in the mail If you have any lab test that is abnormal or we need to change your treatment, we will call you to review the results.   Testing/Procedures: None   Follow-Up: At [facility], you and your health needs are our priority.  As part of our continuing mission to provide you with exceptional heart care, we have created designated Provider Care Teams.  These Care Teams include your primary Cardiologist (physician) and Advanced Practice Providers (APPs -  Physician Assistants and Nurse Practitioners) who all work together to provide you with the care you need, when you need it.  We recommend signing up for the patient portal called "MyChart".  Sign up information is provided on this After Visit Summary.  MyChart is used to connect with patients for Virtual Visits (Telemedicine).  Patients are able to view lab/test results, encounter notes, upcoming appointments, etc.  Non-urgent messages can be sent to your provider as well.   To learn more about what you can do with MyChart, go to [email or web address].    Your next appointment:   1 week(s)  The format for your next appointment:   In Person  Provider:   Shirlee More, MD   Other Instructions Please purchase the alive cor smart phone adapter on [facility] and record your heart rate and rhythm daily.

## 2019-09-08 NOTE — Progress Notes (Signed)
Cardiology Office Note:    Date:  09/08/2019   ID:  Shelby Kirk, DOB [birth year], MRN [id number]  PCP:  Shelby Camel, NP  Cardiologist:  Shelby More, MD    Referring MD: Shelby He*    ASSESSMENT:    1. Persistent atrial fibrillation ([place])   2. Chronic anticoagulation   3. Hypertensive heart disease with heart failure ([place])    PLAN:    In order of problems listed above:  1. She has good healthcare literacy I think she is correct that she is in and out of atrial fibrillation and is opposed to direct referral to cardioversion would put her on low-dose flecainide she is good to purchase the iPhone adapter check her heart rhythm at home and I will see back in the office in 1 week if persistent cardioversion is appropriate.  She will continue her rate limiting calcium channel blocker for rate control and current anticoagulant. 2. Stable at target continue ACE inhibitor 3. Tremor check T3-T4 free and TSH   Next appointment: 1 week   Medication Adjustments/Labs and Tests Ordered: Current medicines are reviewed at length with the patient today.  Concerns regarding medicines are outlined above.  Orders Placed This Encounter  Procedures  . TSH+T4F+T3Free  . Pro b natriuretic peptide (BNP)   Meds ordered this encounter  Medications  . flecainide (TAMBOCOR) 50 MG tablet    Sig: Take 1 tablet (50 mg total) by mouth 2 (two) times daily.    Dispense:  180 tablet    Refill:  3    No chief complaint on file.   History of Present Illness:    Shelby Kirk is a 64 y.o. female with a hx of palpitation with symptomatic APCs hypertension and sleep apnea. She was last seen 07/27/2019. She is seen at [facility] ED and was found to be in atrial fibrillation. EKG 07/27/2019 showed atrial fibrillation rate 100 205 bpm, she was still in atrial fibrillation on 07/29/2019. She was placed on oral calcium channel blocker and she was also hypertensive and  started on anticoagulation with direct anticoagulant Eliquis. 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  She is aware of it now she does not feel well she is also noticed that she has developed a tremor and wonders if her thyroid is excessive.  No chest pain syncope shortness of breath.  She had an echocardiogram performed 0 08/17/2019 showing normal left ventricular size mild LVH and normal systolic function. Mitral valve is described as normal without stenosis or regurgitation and there is no other valvular abnormality. Past Medical History:  Diagnosis Date  . Adult situational stress disorder 04/26/2016  . Benign neoplasm of choroid 05/27/2011  . Chronic pain of multiple joints 10/13/2015  . Depression 10/14/2015  . Essential hypertension 04/26/2016  . Fatigue 09/23/2016  . Hypothyroidism 10/14/2015  . Malaise and fatigue 05/06/2019  . Menopausal flushing 05/06/2019  . Murmur, heart 09/23/2016  . MVP (mitral valve prolapse) 10/14/2015  . OSA (obstructive sleep apnea) 10/14/2015  . Palpitation 10/02/2014  . Rheumatoid arthritis ([place]) 09/23/2016  . Screening cholesterol level 04/26/2016    Past Surgical History:  Procedure Laterality Date  . BACK SURGERY    . CESAREAN SECTION    .  CHOLECYSTECTOMY      Current Medications: Current Meds  Medication Sig  . apixaban (ELIQUIS) 5 MG TABS tablet Take 1 tablet (5 mg total) by mouth 2 (two) times daily.  . cyclobenzaprine (FLEXERIL) 10 MG tablet Take 10 mg by mouth daily as needed for muscle spasms.  Marland Kitchen diltiazem (CARDIZEM CD) 180 MG 24 hr capsule Take 1 capsule (180 mg total) by mouth daily.  Marland Kitchen ibuprofen (ADVIL,MOTRIN) 800 MG tablet TAKE ONE TABLET BY MOUTH THREE TIMES DAILY WITH FOOD OR MILK FOR JOINT PAIN  .  levothyroxine (SYNTHROID, LEVOTHROID) 112 MCG tablet Take 112 mcg by mouth daily.  Marland Kitchen lisinopril (PRINIVIL,ZESTRIL) 40 MG tablet Take 40 mg by mouth daily.     Allergies:   Penicillins and Sulfa antibiotics   Social History   Socioeconomic History  . Marital status: Married    Spouse name: Not on file  . Number of children: Not on file  . Years of education: Not on file  . Highest education level: Not on file  Occupational History  . Not on file  Tobacco Use  . Smoking status: Never Smoker  . Smokeless tobacco: Never Used  Vaping Use  . Vaping Use: Never used  Substance and Sexual Activity  . Alcohol use: No  . Drug use: No  . Sexual activity: Not on file  Other Topics Concern  . Not on file  Social History Narrative  . Not on file   Social Determinants of Health   Financial Resource Strain:   . Difficulty of Paying Living Expenses: Not on file  Food Insecurity:   . Worried About Charity fundraiser in the Last Year: Not on file  . Ran Out of Food in the Last Year: Not on file  Transportation Needs:   . Lack of Transportation (Medical): Not on file  . Lack of Transportation (Non-Medical): Not on file  Physical Activity:   . Days of Exercise per Week: Not on file  . Minutes of Exercise per Session: Not on file  Stress:   . Feeling of Stress : Not on file  Social Connections:   . Frequency of Communication with Friends and Family: Not on file  . Frequency of Social Gatherings with Friends and Family: Not on file  . Attends Religious Services: Not on file  . Active Member of Clubs or Organizations: Not on file  . Attends Archivist Meetings: Not on file  . Marital Status: Not on file     Family History: The patient's family history includes Bone cancer in her father; CAD in her father and mother; Heart attack in her maternal grandfather and maternal grandmother; Hyperlipidemia in her father and mother; Hypertension in her father and mother; Stroke in her  maternal grandmother. ROS:   Please see the history of present illness.    All other systems reviewed and are negative.  EKGs/Labs/Other Studies Reviewed:    The following studies were reviewed today:  EKG:  EKG ordered today and personally reviewed.  The ekg ordered today demonstrates atrial fibrillation rate 108 bpm  Recent Labs: 07/26/2019: BUN 14; Creatinine, Ser 0.84; Hemoglobin 17.6; Magnesium 2.1; Platelets 227; Potassium 4.2; Sodium 142  TSH was normal April  Physical Exam:    VS:  BP 128/66 (BP Location: Right Arm, Patient Position: Sitting, Cuff Size: Normal)   Pulse 93   Ht 5\' 7"  (1.702 m)   Wt 236 lb 3.2 oz (107.1 kg)   SpO2 96%   BMI 36.99 kg/m  Wt Readings from Last 3 Encounters:  09/08/19 236 lb 3.2 oz (107.1 kg)  07/27/19 236 lb (107 kg)  07/26/19 240 lb (108.9 kg)     GEN: She has a tremor well nourished, well developed in no acute distress HEENT: Normal NECK: No JVD; No carotid bruits LYMPHATICS: No lymphadenopathy CARDIAC: Irregular S1 variable no murmurs, rubs, gallops RESPIRATORY:  Clear to auscultation without rales, wheezing or rhonchi  ABDOMEN: Soft, non-tender, non-distended MUSCULOSKELETAL:  No edema; No deformity  SKIN: Warm and dry NEUROLOGIC:  Alert and oriented x 3 PSYCHIATRIC:  Normal affect    Signed, Shelby More, MD  09/08/2019 4:19 PM    [facility]

## 2019-09-09 ENCOUNTER — Telehealth: Payer: Self-pay

## 2019-09-09 LAB — PRO B NATRIURETIC PEPTIDE: NT-Pro BNP: 299 pg/mL — ABNORMAL HIGH (ref 0–287)

## 2019-09-09 LAB — TSH+T4F+T3FREE
Free T4: 1.56 ng/dL (ref 0.82–1.77)
T3, Free: 2.3 pg/mL (ref 2.0–4.4)
TSH: 1.44 u[IU]/mL (ref 0.450–4.500)

## 2019-09-09 NOTE — Telephone Encounter (Signed)
-----   Message from Richardo Priest, MD sent at 09/09/2019  8:57 AM EDT ----- Good result her thyroid is normal

## 2019-09-09 NOTE — Telephone Encounter (Signed)
Left message on patients voicemail to please return our call.   

## 2019-09-09 NOTE — Addendum Note (Signed)
Addended by: Resa Miner I on: 09/09/2019 02:32 PM   Modules accepted: Orders

## 2019-09-13 ENCOUNTER — Telehealth: Payer: Self-pay

## 2019-09-13 NOTE — Telephone Encounter (Signed)
-----   Message from Richardo Priest, MD sent at 09/09/2019  8:57 AM EDT ----- Good result her thyroid is normal

## 2019-09-13 NOTE — Telephone Encounter (Signed)
Spoke with patient regarding results and recommendation.  Patient verbalizes understanding and is agreeable to plan of care. Advised patient to call back with any issues or concerns.  

## 2019-09-13 NOTE — Telephone Encounter (Signed)
Left message on patients voicemail to please return our call.   

## 2019-09-13 NOTE — Telephone Encounter (Signed)
Patient returning call.

## 2019-09-15 NOTE — Progress Notes (Signed)
Cardiology Office Note:    Date:  09/16/2019   ID:  Shelby Kirk, DOB 1955/12/10, MRN [id number]  PCP:  Mayer Camel, NP  Cardiologist:  Shirlee More, MD    Referring MD: Bess Harvest*    ASSESSMENT:    1. Persistent atrial fibrillation ([place])   2. High risk medication use   3. Chronic anticoagulation   4. Hypertensive heart disease with heart failure ([place])    PLAN:    In order of problems listed above:  1. She is not ready to accept cardioversion has a great deal of anxiety at this time prefers to remain in rate controlled atrial fibrillation with anticoagulation.  We will stop flecainide she will screen her heart rhythm at home and if sinus rhythm resumes we will put her back on.  I will see back in the office in 3 months to reassess and she will continue to think about elective outpatient cardioversion 2. Start flecainide ineffective 3. Continue anticoagulation 4. Stable continue ACE inhibitor calcium channel blocker   Next appointment: 3 months   Medication Adjustments/Labs and Tests Ordered: Current medicines are reviewed at length with the patient today.  Concerns regarding medicines are outlined above.  Orders Placed This Encounter  Procedures   EKG 12-Lead   No orders of the defined types were placed in this encounter.   Chief Complaint  Patient presents with   Congestive Heart Failure    History of Present Illness:    Shelby Kirk is a 64 y.o. female with a hx of paroxysmal atrial fibrillation recently initiated on flecainide, chronic anticoagulation and hypertensive heart disease with heart failure last seen 09/08/2019.  She also complained of tremor the last visit thyroid studies performed normal. Compliance with diet, lifestyle and medications: Yes   Ref Range & Units 7 d ago  TSH 0.450 - 4.500 uIU/mL 1.440   T3, Free 2.0 - 4.4 pg/mL 2.3   Free T4 0.82 - 1.77 ng/dL 1.56   Her proBNP level was not elevated  Ref Range &  Units 7 d ago  NT-Pro BNP 0 - 287 pg/mL 299High    Great deal of anxiety in her personal life does not want to have cardioversion.  She has purchased the iPhone adapter she is awaiting delivery we will screen her heart rhythm at home for now we will stop flecainide she is in rate controlled atrial fibrillation and if she has sinus rhythm resumed we will put her back on it she will continue rate slowing anticoagulation I will see back in the office in 3 months for final decision.  She is not having edema chest pain shortness of breath palpitation and can do her activities and work without cardiovascular symptoms Past Medical History:  Diagnosis Date   Adult situational stress disorder 04/26/2016   Benign neoplasm of choroid 05/27/2011   Chronic pain of multiple joints 10/13/2015   Depression 10/14/2015   Essential hypertension 04/26/2016   Fatigue 09/23/2016   Hypothyroidism 10/14/2015   Malaise and fatigue 05/06/2019   Menopausal flushing 05/06/2019   Murmur, heart 09/23/2016   MVP (mitral valve prolapse) 10/14/2015   OSA (obstructive sleep apnea) 10/14/2015   Palpitation 10/02/2014   Rheumatoid arthritis ([place]) 09/23/2016   Screening cholesterol level 04/26/2016    Past Surgical History:  Procedure Laterality Date   BACK SURGERY     CESAREAN SECTION     CHOLECYSTECTOMY      Current Medications: Current Meds  Medication Sig   apixaban (  ELIQUIS) 5 MG TABS tablet Take 1 tablet (5 mg total) by mouth 2 (two) times daily.   COMBIPATCH 0.05-0.14 MG/DAY 1 patch 2 (two) times a week.   diltiazem (CARDIZEM CD) 180 MG 24 hr capsule Take 1 capsule (180 mg total) by mouth daily.   ibuprofen (ADVIL,MOTRIN) 800 MG tablet TAKE ONE TABLET BY MOUTH THREE TIMES DAILY WITH FOOD OR MILK FOR JOINT PAIN   levothyroxine (SYNTHROID, LEVOTHROID) 112 MCG tablet Take 112 mcg by mouth daily.   lisinopril (PRINIVIL,ZESTRIL) 40 MG tablet Take 40 mg by mouth daily.   [DISCONTINUED] flecainide  (TAMBOCOR) 50 MG tablet Take 1 tablet (50 mg total) by mouth 2 (two) times daily.     Allergies:   Penicillins, Sulfa antibiotics, and Methotrexate   Social History   Socioeconomic History   Marital status: Married    Spouse name: Not on file   Number of children: Not on file   Years of education: Not on file   Highest education level: Not on file  Occupational History   Not on file  Tobacco Use   Smoking status: Never Smoker   Smokeless tobacco: Never Used  Vaping Use   Vaping Use: Never used  Substance and Sexual Activity   Alcohol use: No   Drug use: No   Sexual activity: Not on file  Other Topics Concern   Not on file  Social History Narrative   Not on file   Social Determinants of Health   Financial Resource Strain:    Difficulty of Paying Living Expenses: Not on file  Food Insecurity:    Worried About [place] in the Last Year: Not on file   Ran Out of Food in the Last Year: Not on file  Transportation Needs:    Lack of Transportation (Medical): Not on file   Lack of Transportation (Non-Medical): Not on file  Physical Activity:    Days of Exercise per Week: Not on file   Minutes of Exercise per Session: Not on file  Stress:    Feeling of Stress : Not on file  Social Connections:    Frequency of Communication with Friends and Family: Not on file   Frequency of Social Gatherings with Friends and Family: Not on file   Attends Religious Services: Not on file   Active Member of Clubs or Organizations: Not on file   Attends Archivist Meetings: Not on file   Marital Status: Not on file     Family History: The patient's family history includes Bone cancer in her father; CAD in her father and mother; Heart attack in her maternal grandfather and maternal grandmother; Hyperlipidemia in her father and mother; Hypertension in her father and mother; Stroke in her maternal grandmother. ROS:   Please see the history of  present illness.    All other systems reviewed and are negative.  EKGs/Labs/Other Studies Reviewed:    The following studies were reviewed today:  EKG:  EKG ordered today and personally reviewed.  The ekg ordered today demonstrates atrial fibrillation rate controlled  Recent Labs: 07/26/2019: BUN 14; Creatinine, Ser 0.84; Hemoglobin 17.6; Magnesium 2.1; Platelets 227; Potassium 4.2; Sodium 142 09/08/2019: NT-Pro BNP 299; TSH 1.440  Recent Lipid Panel No results found for: CHOL, TRIG, HDL, CHOLHDL, VLDL, LDLCALC, LDLDIRECT  Physical Exam:    VS:  BP (!) 146/95 (BP Location: Left Arm)    Pulse 100    Ht 5\' 7"  (1.702 m)    Wt 238 lb 9.6  oz (108.2 kg)    SpO2 96%    BMI 37.37 kg/m     Wt Readings from Last 3 Encounters:  09/16/19 238 lb 9.6 oz (108.2 kg)  09/08/19 236 lb 3.2 oz (107.1 kg)  07/27/19 236 lb (107 kg)     GEN: She looks anxious little tremulous well nourished, well developed in no acute distress last visit thyroid studies were normal HEENT: Normal NECK: No JVD; No carotid bruits LYMPHATICS: No lymphadenopathy CARDIAC: RRR, no murmurs, rubs, gallops RESPIRATORY:  Clear to auscultation without rales, wheezing or rhonchi  ABDOMEN: Soft, non-tender, non-distended MUSCULOSKELETAL:  No edema; No deformity  SKIN: Warm and dry NEUROLOGIC:  Alert and oriented x 3 PSYCHIATRIC:  Normal affect    Signed, Shirlee More, MD  09/16/2019 10:22 AM    [facility]

## 2019-09-16 ENCOUNTER — Ambulatory Visit: Payer: 59 | Admitting: Cardiology

## 2019-09-16 ENCOUNTER — Encounter: Payer: Self-pay | Admitting: Cardiology

## 2019-09-16 ENCOUNTER — Other Ambulatory Visit: Payer: Self-pay

## 2019-09-16 VITALS — BP 146/95 | HR 100 | Ht 67.0 in | Wt 238.6 lb

## 2019-09-16 DIAGNOSIS — Z79899 Other long term (current) drug therapy: Secondary | ICD-10-CM

## 2019-09-16 DIAGNOSIS — I4819 Other persistent atrial fibrillation: Secondary | ICD-10-CM

## 2019-09-16 DIAGNOSIS — I110 Hypertensive heart disease with heart failure: Secondary | ICD-10-CM | POA: Diagnosis not present

## 2019-09-16 DIAGNOSIS — Z7901 Long term (current) use of anticoagulants: Secondary | ICD-10-CM | POA: Diagnosis not present

## 2019-09-16 NOTE — Patient Instructions (Signed)
Medication Instructions:  Your physician has recommended you make the following change in your medication:  STOP: Flecainide *If you need a refill on your cardiac medications before your next appointment, please call your pharmacy*   Lab Work: None If you have labs (blood work) drawn today and your tests are completely normal, you will receive your results only by: Marland Kitchen MyChart Message (if you have MyChart) OR . A paper copy in the mail If you have any lab test that is abnormal or we need to change your treatment, we will call you to review the results.   Testing/Procedures: None   Follow-Up: At [facility], you and your health needs are our priority.  As part of our continuing mission to provide you with exceptional heart care, we have created designated Provider Care Teams.  These Care Teams include your primary Cardiologist (physician) and Advanced Practice Providers (APPs -  Physician Assistants and Nurse Practitioners) who all work together to provide you with the care you need, when you need it.  We recommend signing up for the patient portal called "MyChart".  Sign up information is provided on this After Visit Summary.  MyChart is used to connect with patients for Virtual Visits (Telemedicine).  Patients are able to view lab/test results, encounter notes, upcoming appointments, etc.  Non-urgent messages can be sent to your provider as well.   To learn more about what you can do with MyChart, go to [email or web address].    Your next appointment:   3 month(s)  The format for your next appointment:   In Person  Provider:   Shirlee More, MD   Other Instructions

## 2019-11-03 DIAGNOSIS — I4891 Unspecified atrial fibrillation: Secondary | ICD-10-CM

## 2019-12-02 ENCOUNTER — Ambulatory Visit: Payer: 59 | Admitting: Cardiology

## 2019-12-02 ENCOUNTER — Other Ambulatory Visit: Payer: Self-pay

## 2019-12-02 ENCOUNTER — Encounter: Payer: Self-pay | Admitting: Cardiology

## 2019-12-02 VITALS — BP 128/70 | HR 138 | Ht 67.0 in | Wt 239.4 lb

## 2019-12-02 DIAGNOSIS — I4819 Other persistent atrial fibrillation: Secondary | ICD-10-CM | POA: Diagnosis not present

## 2019-12-02 MED ORDER — DILTIAZEM HCL ER COATED BEADS 240 MG PO CP24
240.0000 mg | ORAL_CAPSULE | Freq: Every day | ORAL | 3 refills | Status: DC
Start: 2019-12-02 — End: 2020-03-24

## 2019-12-02 NOTE — Patient Instructions (Signed)
Medication Instructions:  Your physician has recommended you make the following change in your medication:  1. INCREASE Diltiazem to 240 mg once daily  *If you need a refill on your cardiac medications before your next appointment, please call your pharmacy*   Lab Work: None ordered   Testing/Procedures: Your physician has recommended that you have an ablation. Catheter ablation is a medical procedure used to treat some cardiac arrhythmias (irregular heartbeats). During catheter ablation, a long, thin, flexible tube is put into a blood vessel in your groin (upper thigh), or neck. This tube is called an ablation catheter. It is then guided to your heart through the blood vessel. Radio frequency waves destroy small areas of heart tissue where abnormal heartbeats may cause an arrhythmia to start.  Please call the nurse if/when you are ready to schedule this procedure.  The following dates are available (these are subject to change): 1/7, 1/12,   1/14, 1/18, 1/19, 1/26, 1/28   Follow-Up: At [facility], you and your health needs are our priority.  As part of our continuing mission to provide you with exceptional heart care, we have created designated Provider Care Teams.  These Care Teams include your primary Cardiologist (physician) and Advanced Practice Providers (APPs -  Physician Assistants and Nurse Practitioners) who all work together to provide you with the care you need, when you need it.  Your next appointment:    to be determined  The format for your next appointment:   In Person  Provider:   Allegra Lai, MD    Thank you for choosing [facility]!!   Trinidad Curet, RN [phone]   Other Instructions  Electrical Cardioversion Electrical cardioversion is the delivery of a jolt of electricity to restore a normal rhythm to the heart. A rhythm that is too fast or is not regular keeps the heart from pumping well. In this procedure, sticky patches or metal paddles are  placed on the chest to deliver electricity to the heart from a device. This procedure may be done in an emergency if:  There is low or no blood pressure as a result of the heart rhythm.  Normal rhythm must be restored as fast as possible to protect the brain and heart from further damage.  It may save a life. This may also be a scheduled procedure for irregular or fast heart rhythms that are not immediately life-threatening. Tell a health care provider about:  Any allergies you have.  All medicines you are taking, including vitamins, herbs, eye drops, creams, and over-the-counter medicines.  Any problems you or family members have had with anesthetic medicines.  Any blood disorders you have.  Any surgeries you have had.  Any medical conditions you have.  Whether you are pregnant or may be pregnant. What are the risks? Generally, this is a safe procedure. However, problems may occur, including:  Allergic reactions to medicines.  A blood clot that breaks free and travels to other parts of your body.  The possible return of an abnormal heart rhythm within hours or days after the procedure.  Your heart stopping (cardiac arrest). This is rare. What happens before the procedure? Medicines  Your health care provider may have you start taking: ? Blood-thinning medicines (anticoagulants) so your blood does not clot as easily. ? Medicines to help stabilize your heart rate and rhythm.  Ask your health care provider about: ? Changing or stopping your regular medicines. This is especially important if you are taking diabetes medicines or blood  thinners. ? Taking medicines such as aspirin and ibuprofen. These medicines can thin your blood. Do not take these medicines unless your health care provider tells you to take them. ? Taking over-the-counter medicines, vitamins, herbs, and supplements. General instructions  Follow instructions from your health care provider about eating or  drinking restrictions.  Plan to have someone take you home from the hospital or clinic.  If you will be going home right after the procedure, plan to have someone with you for 24 hours.  Ask your health care provider what steps will be taken to help prevent infection. These may include washing your skin with a germ-killing soap. What happens during the procedure?   An IV will be inserted into one of your veins.  Sticky patches (electrodes) or metal paddles may be placed on your chest.  You will be given a medicine to help you relax (sedative).  An electrical shock will be delivered. The procedure may vary among health care providers and hospitals. What can I expect after the procedure?  Your blood pressure, heart rate, breathing rate, and blood oxygen level will be monitored until you leave the hospital or clinic.  Your heart rhythm will be watched to make sure it does not change.  You may have some redness on the skin where the shocks were given. Follow these instructions at home:  Do not drive for 24 hours if you were given a sedative during your procedure.  Take over-the-counter and prescription medicines only as told by your health care provider.  Ask your health care provider how to check your pulse. Check it often.  Rest for 48 hours after the procedure or as told by your health care provider.  Avoid or limit your caffeine use as told by your health care provider.  Keep all follow-up visits as told by your health care provider. This is important. Contact a health care provider if:  You feel like your heart is beating too quickly or your pulse is not regular.  You have a serious muscle cramp that does not go away. Get help right away if:  You have discomfort in your chest.  You are dizzy or you feel faint.  You have trouble breathing or you are short of breath.  Your speech is slurred.  You have trouble moving an arm or leg on one side of your body.  Your  fingers or toes turn cold or blue. Summary  Electrical cardioversion is the delivery of a jolt of electricity to restore a normal rhythm to the heart.  This procedure may be done right away in an emergency or may be a scheduled procedure if the condition is not an emergency.  Generally, this is a safe procedure.  After the procedure, check your pulse often as told by your health care provider. This information is not intended to replace advice given to you by your health care provider. Make sure you discuss any questions you have with your health care provider. Document Revised: 08/03/2018 Document Reviewed: 08/03/2018 Elsevier Patient Education  [place].    Flecainide tablets What is this medicine? FLECAINIDE (FLEK a nide) is an antiarrhythmic drug. This medicine is used to prevent irregular heart rhythm. It can also slow down fast heartbeats called tachycardia. This medicine may be used for other purposes; ask your health care provider or pharmacist if you have questions. COMMON BRAND NAME(S): Tambocor What should I tell my health care provider before I take this medicine? They need to know  if you have any of these conditions:  abnormal levels of potassium in the blood  heart disease including heart rhythm and heart rate problems  kidney or liver disease  recent heart attack  an unusual or allergic reaction to flecainide, local anesthetics, other medicines, foods, dyes, or preservatives  pregnant or trying to get pregnant  breast-feeding How should I use this medicine? Take this medicine by mouth with a glass of water. Follow the directions on the prescription label. You can take this medicine with or without food. Take your doses at regular intervals. Do not take your medicine more often than directed. Do not stop taking this medicine suddenly. This may cause serious, heart-related side effects. If your doctor wants you to stop the medicine, the dose may be slowly  lowered over time to avoid any side effects. Talk to your pediatrician regarding the use of this medicine in children. While this drug may be prescribed for children as young as 1 year of age for selected conditions, precautions do apply. Overdosage: If you think you have taken too much of this medicine contact a poison control center or emergency room at once. NOTE: This medicine is only for you. Do not share this medicine with others. What if I miss a dose? If you miss a dose, take it as soon as you can. If it is almost time for your next dose, take only that dose. Do not take double or extra doses. What may interact with this medicine? Do not take this medicine with any of the following medications:  amoxapine  arsenic trioxide  certain antibiotics like clarithromycin, erythromycin, gatifloxacin, gemifloxacin, levofloxacin, moxifloxacin, sparfloxacin, or troleandomycin  certain antidepressants called tricyclic antidepressants like amitriptyline, imipramine, or nortriptyline  certain medicines to control heart rhythm like disopyramide, encainide, moricizine, procainamide, propafenone, and quinidine  cisapride  delavirdine  droperidol  haloperidol  [place]  imatinib  levomethadyl  maprotiline  medicines for malaria like chloroquine and halofantrine  pentamidine  phenothiazines like chlorpromazine, mesoridazine, prochlorperazine, thioridazine  pimozide  quinine  ranolazine  ritonavir  sertindole This medicine may also interact with the following medications:  cimetidine  dofetilide  medicines for angina or high blood pressure  medicines to control heart rhythm like amiodarone and digoxin  ziprasidone This list may not describe all possible interactions. Give your health care provider a list of all the medicines, herbs, non-prescription drugs, or dietary supplements you use. Also tell them if you smoke, drink alcohol, or use illegal drugs. Some items may  interact with your medicine. What should I watch for while using this medicine? Visit your doctor or health care professional for regular checks on your progress. Because your condition and the use of this medicine carries some risk, it is a good idea to carry an identification card, necklace or bracelet with details of your condition, medications and doctor or health care professional. Check your blood pressure and pulse rate regularly. Ask your health care professional what your blood pressure and pulse rate should be, and when you should contact him or her. Your doctor or health care professional also may schedule regular blood tests and electrocardiograms to check your progress. You may get drowsy or dizzy. Do not drive, use machinery, or do anything that needs mental alertness until you know how this medicine affects you. Do not stand or sit up quickly, especially if you are an older patient. This reduces the risk of dizzy or fainting spells. Alcohol can make you more dizzy, increase flushing and rapid  heartbeats. Avoid alcoholic drinks. What side effects may I notice from receiving this medicine? Side effects that you should report to your doctor or health care professional as soon as possible:  chest pain, continued irregular heartbeats  difficulty breathing  swelling of the legs or feet  trembling, shaking  unusually weak or tired Side effects that usually do not require medical attention (report to your doctor or health care professional if they continue or are bothersome):  blurred vision  constipation  headache  nausea, vomiting  stomach pain This list may not describe all possible side effects. Call your doctor for medical advice about side effects. You may report side effects to FDA at 1-800-FDA-1088. Where should I keep my medicine? Keep out of the reach of children. Store at room temperature between 15 and 30 degrees C (59 and 86 degrees F). Protect from light. Keep  container tightly closed. Throw away any unused medicine after the expiration date. NOTE: This sheet is a summary. It may not cover all possible information. If you have questions about this medicine, talk to your doctor, pharmacist, or health care provider.  2020 Elsevier/Gold Standard (2017-12-22 11:41:38)    Atrial Fibrillation  Atrial fibrillation is a type of irregular or rapid heartbeat (arrhythmia). In atrial fibrillation, the top part of the heart (atria) beats in an irregular pattern. This makes the heart unable to pump blood normally and effectively. The goal of treatment is to prevent blood clots from forming, control your heart rate, or restore your heartbeat to a normal rhythm. If this condition is not treated, it can cause serious problems, such as a weakened heart muscle (cardiomyopathy) or a stroke. What are the causes? This condition is often caused by medical conditions that damage the heart's electrical system. These include:  High blood pressure (hypertension). This is the most common cause.  Certain heart problems or conditions, such as heart failure, coronary artery disease, heart valve problems, or heart surgery.  Diabetes.  Overactive thyroid (hyperthyroidism).  Obesity.  Chronic kidney disease. In some cases, the cause of this condition is not known. What increases the risk? This condition is more likely to develop in:  Older people.  People who smoke.  Athletes who do endurance exercise.  People who have a family history of atrial fibrillation.  Men.  People who use drugs.  People who drink a lot of alcohol.  People who have lung conditions, such as emphysema, pneumonia, or COPD.  People who have obstructive sleep apnea. What are the signs or symptoms? Symptoms of this condition include:  A feeling that your heart is racing or beating irregularly.  Discomfort or pain in your chest.  Shortness of breath.  Sudden light-headedness or  weakness.  Tiring easily during exercise or activity.  Fatigue.  Syncope (fainting).  Sweating. In some cases, there are no symptoms. How is this diagnosed? Your health care provider may detect atrial fibrillation when taking your pulse. If detected, this condition may be diagnosed with:  An electrocardiogram (ECG) to check electrical signals of the heart.  An ambulatory cardiac monitor to record your heart's activity for a few days.  A transthoracic echocardiogram (TTE) to create pictures of your heart.  A transesophageal echocardiogram (TEE) to create even closer pictures of your heart.  A stress test to check your blood supply while you exercise.  Imaging tests, such as a CT scan or chest X-ray.  Blood tests. How is this treated? Treatment depends on underlying conditions and how you feel when  you experience atrial fibrillation. This condition may be treated with:  Medicines to prevent blood clots or to treat heart rate or heart rhythm problems.  Electrical cardioversion to reset the heart's rhythm.  A pacemaker to correct abnormal heart rhythm.  Ablation to remove the heart tissue that sends abnormal signals.  Left atrial appendage closure to seal the area where blood clots can form. In some cases, underlying conditions will be treated. Follow these instructions at home: Medicines  Take over-the counter and prescription medicines only as told by your health care provider.  Do not take any new medicines without talking to your health care provider.  If you are taking blood thinners: ? Talk with your health care provider before you take any medicines that contain aspirin or NSAIDs, such as ibuprofen. These medicines increase your risk for dangerous bleeding. ? Take your medicine exactly as told, at the same time every day. ? Avoid activities that could cause injury or bruising, and follow instructions about how to prevent falls. ? Wear a medical alert bracelet or  carry a card that lists what medicines you take. Lifestyle      Do not use any products that contain nicotine or tobacco, such as cigarettes, e-cigarettes, and chewing tobacco. If you need help quitting, ask your health care provider.  Eat heart-healthy foods. Talk with a dietitian to make an eating plan that is right for you.  Exercise regularly as told by your health care provider.  Do not drink alcohol.  Lose weight if you are overweight.  Do not use drugs, including cannabis. General instructions  If you have obstructive sleep apnea, manage your condition as told by your health care provider.  Do not use diet pills unless your health care provider approves. Diet pills can make heart problems worse.  Keep all follow-up visits as told by your health care provider. This is important. Contact a health care provider if you:  Notice a change in the rate, rhythm, or strength of your heartbeat.  Are taking a blood thinner and you notice more bruising.  Tire more easily when you exercise or do heavy work.  Have a sudden change in weight. Get help right away if you have:   Chest pain, abdominal pain, sweating, or weakness.  Trouble breathing.  Side effects of blood thinners, such as blood in your vomit, stool, or urine, or bleeding that cannot stop.  Any symptoms of a stroke. "BE FAST" is an easy way to remember the main warning signs of a stroke: ? B - Balance. Signs are dizziness, sudden trouble walking, or loss of balance. ? E - Eyes. Signs are trouble seeing or a sudden change in vision. ? F - Face. Signs are sudden weakness or numbness of the face, or the face or eyelid drooping on one side. ? A - Arms. Signs are weakness or numbness in an arm. This happens suddenly and usually on one side of the body. ? S - Speech. Signs are sudden trouble speaking, slurred speech, or trouble understanding what people say. ? T - Time. Time to call emergency services. Write down what  time symptoms started.  Other signs of a stroke, such as: ? A sudden, severe headache with no known cause. ? Nausea or vomiting. ? Seizure. These symptoms may represent a serious problem that is an emergency. Do not wait to see if the symptoms will go away. Get medical help right away. Call your local emergency services (911 in the [place] Do not  drive yourself to the hospital. Summary  Atrial fibrillation is a type of irregular or rapid heartbeat (arrhythmia).  Symptoms include a feeling that your heart is beating fast or irregularly.  You may be given medicines to prevent blood clots or to treat heart rate or heart rhythm problems.  Get help right away if you have signs or symptoms of a stroke.  Get help right away if you cannot catch your breath or have chest pain or pressure. This information is not intended to replace advice given to you by your health care provider. Make sure you discuss any questions you have with your health care provider. Document Revised: 06/24/2018 Document Reviewed: 06/24/2018 Elsevier Patient Education  [place].     Cardiac Ablation  Cardiac ablation is a procedure to stop some heart tissue from causing problems. The heart has many electrical connections. Sometimes these connections make the heart beat very fast or irregularly. Removing some problem areas can improve the heart rhythm or make it normal. What happens before the procedure?  Follow instructions from your doctor about what you cannot eat or drink.  Ask your doctor about: ? Changing or stopping your normal medicines. This is important if you take diabetes medicines or blood thinners. ? Taking medicines such as aspirin and ibuprofen. These medicines can thin your blood. Do not take these medicines before your procedure if your doctor tells you not to.  Plan to have someone take you home.  If you will be going home right after the procedure, plan to have someone with you for 24  hours. What happens during the procedure?  To lower your risk of infection: ? Your health care team will wash or sanitize their hands. ? Your skin will be washed with soap. ? Hair may be removed from your neck or groin.  An IV tube will be put into one of your veins.  You will be given a medicine to help you relax (sedative).  Skin on your neck or groin will be numbed.  A cut (incision) will be made in your neck or groin.  A needle will be put through your cut and into a vein in your neck or groin.  A tube (catheter) will be put into the needle. The tube will be moved to your heart. X-rays (fluoroscopy) will be used to help guide the tube.  Small devices (electrodes) on the tip of the tube will send out electrical currents.  Dye may be put through the tube. This helps your surgeon see your heart.  Electrical energy will be used to scar (ablate) some heart tissue. Your surgeon may use: ? Heat (radiofrequency energy). ? Laser energy. ? Extreme cold (cryoablation).  The tube will be taken out.  Pressure will be held on your cut. This helps stop bleeding.  A bandage (dressing) will be put on your cut. The procedure may vary. What happens after the procedure?  You will be monitored until your medicines have worn off.  Your cut will be watched for bleeding. You will need to lie still for a few hours.  Do not drive for 24 hours or as long as your doctor tells you. Summary  Cardiac ablation is a procedure to stop some heart tissue from causing problems.  Electrical energy will be used to scar (ablate) some heart tissue. This information is not intended to replace advice given to you by your health care provider. Make sure you discuss any questions you have with your health care provider.  Document Revised: 12/13/2016 Document Reviewed: 11/20/2015 Elsevier Patient Education  2020 [employer].

## 2019-12-02 NOTE — Progress Notes (Signed)
Electrophysiology Office Note   Date:  12/02/2019   ID:  Shelby Kirk, DOB [birth year], MRN [id number]  PCP:  Mayer Camel, NP  Cardiologist:  Bettina Gavia Primary Electrophysiologist:  Constance Haw, MD    Chief Complaint: AF   History of Present Illness: Shelby Kirk is a 64 y.o. female who is being seen today for the evaluation of AF at the request of Bettina Gavia, Hilton Cork, MD. Presenting today for electrophysiology evaluation.  She has a history significant for hypertension, OSA, and paroxysmal atrial fibrillation.  She went into atrial fibrillation and was started on flecainide.  At that time, she did not wish to be cardioverted and flecainide was stopped.  Since then, she has felt much worse.  She has noted rapid heart rates and has had quite a bit of weakness, fatigue, and shortness of breath.  She works at a [employer] and is quite active during the day, but she has had difficulty at work.  When she gets home, she is found that her heart rate slows down quite a bit and she has less fatigue when she is at rest, but does continue to get fatigued when she is active.  She also has intermittent palpitations.  Today, she denies symptoms of chest pain, orthopnea, PND, lower extremity edema, claudication, dizziness, presyncope, syncope, bleeding, or neurologic sequela. The patient is tolerating medications without difficulties.    Past Medical History:  Diagnosis Date  . Adult situational stress disorder 04/26/2016  . Benign neoplasm of choroid 05/27/2011  . Chronic pain of multiple joints 10/13/2015  . Depression 10/14/2015  . Essential hypertension 04/26/2016  . Fatigue 09/23/2016  . Hypothyroidism 10/14/2015  . Malaise and fatigue 05/06/2019  . Menopausal flushing 05/06/2019  . Murmur, heart 09/23/2016  . MVP (mitral valve prolapse) 10/14/2015  . OSA (obstructive sleep apnea) 10/14/2015  . Palpitation 10/02/2014  . Rheumatoid arthritis ([place]) 09/23/2016  . Screening  cholesterol level 04/26/2016   Past Surgical History:  Procedure Laterality Date  . BACK SURGERY    . CESAREAN SECTION    . CHOLECYSTECTOMY       Current Outpatient Medications  Medication Sig Dispense Refill  . apixaban (ELIQUIS) 5 MG TABS tablet Take 1 tablet (5 mg total) by mouth 2 (two) times daily. 60 tablet 4  . COMBIPATCH 0.05-0.14 MG/DAY 1 patch 2 (two) times a week.    . cyclobenzaprine (FLEXERIL) 10 MG tablet Take 10 mg by mouth daily as needed for muscle spasms.   11  . ibuprofen (ADVIL,MOTRIN) 800 MG tablet TAKE ONE TABLET BY MOUTH THREE TIMES DAILY WITH FOOD OR MILK FOR JOINT PAIN    . levothyroxine (SYNTHROID, LEVOTHROID) 112 MCG tablet Take 112 mcg by mouth daily.    Marland Kitchen lisinopril (PRINIVIL,ZESTRIL) 40 MG tablet Take 40 mg by mouth daily.    Marland Kitchen diltiazem (CARDIZEM CD) 240 MG 24 hr capsule Take 1 capsule (240 mg total) by mouth daily. 30 capsule 3   No current facility-administered medications for this visit.    Allergies:   Penicillins, Sulfa antibiotics, and Methotrexate   Social History:  The patient  reports that she has never smoked. She has never used smokeless tobacco. She reports that she does not drink alcohol and does not use drugs.   Family History:  The patient's family history includes Bone cancer in her father; CAD in her father and mother; Heart attack in her maternal grandfather and maternal grandmother; Hyperlipidemia in her father and mother; Hypertension in her  father and mother; Stroke in her maternal grandmother.    ROS:  Please see the history of present illness.   Otherwise, review of systems is positive for none.   All other systems are reviewed and negative.    PHYSICAL EXAM: VS:  BP 128/70   Pulse (!) 138   Ht 5\' 7"  (1.702 m)   Wt 239 lb 6.4 oz (108.6 kg)   SpO2 97%   BMI 37.50 kg/m  , BMI Body mass index is 37.5 kg/m. GEN: Well nourished, well developed, in no acute distress  HEENT: normal  Neck: no JVD, carotid bruits, or  masses Cardiac: Irregular; no murmurs, rubs, or gallops,no edema  Respiratory:  clear to auscultation bilaterally, normal work of breathing GI: soft, nontender, nondistended, + BS MS: no deformity or atrophy  Skin: warm and dry Neuro:  Strength and sensation are intact Psych: euthymic mood, full affect  EKG:  EKG is ordered today. Personal review of the ekg ordered shows AF, rate 138  Recent Labs: 07/26/2019: BUN 14; Creatinine, Ser 0.84; Hemoglobin 17.6; Magnesium 2.1; Platelets 227; Potassium 4.2; Sodium 142 09/08/2019: NT-Pro BNP 299; TSH 1.440    Lipid Panel  No results found for: CHOL, TRIG, HDL, CHOLHDL, VLDL, LDLCALC, LDLDIRECT   Wt Readings from Last 3 Encounters:  12/02/19 239 lb 6.4 oz (108.6 kg)  09/16/19 238 lb 9.6 oz (108.2 kg)  09/08/19 236 lb 3.2 oz (107.1 kg)      Other studies Reviewed: Additional studies/ records that were reviewed today include: TTE 08/17/19  Review of the above records today demonstrates:  1. Left ventricular ejection fraction, by estimation, is 60 to 65%. The  left ventricle has normal function. The left ventricle has no regional  wall motion abnormalities. There is mild concentric left ventricular  hypertrophy. Left ventricular diastolic  parameters are indeterminate. The average left ventricular global  longitudinal strain is -9.6 %.  2. Right ventricular systolic function is normal. The right ventricular  size is normal. There is normal pulmonary artery systolic pressure.  3. The mitral valve is normal in structure. No evidence of mitral valve  regurgitation. No evidence of mitral stenosis.  4. The aortic valve is normal in structure. Aortic valve regurgitation is  not visualized. No aortic stenosis is present.  5. The inferior vena cava is normal in size with greater than 50%  respiratory variability, suggesting right atrial pressure of 3 mmHg.   ASSESSMENT AND PLAN:  1.  Persistent atrial fibrillation: Currently on Eliquis  with a CHA2DS2-VASc of 2.  Patient initially decided that she would prefer a rate control strategy.  Her flecainide was stopped.  She is feeling quite poorly in atrial fibrillation and would prefer a rhythm control strategy at this point.  Fortunately on her most recent echo her left atrium was not dilated.  I discussed with her medication management with cardioversion versus ablation.  At this point, she would prefer ablation over initiation of flecainide with cardioversion.  Risks and benefits were discussed risk of bleeding, tamponade, heart block, stroke, damage to chest organs, among others.  She is leaning towards ablation but lung like to discuss further with her husband.  To better control her heart rates, Crystie Yanko increase diltiazem to 240 mg.  2.  Obstructive sleep apnea: CPAP compliance encouraged  3. Hypertension: well controlled  Case discussed with primary cardiology  Current medicines are reviewed at length with the patient today.   The patient does not have concerns regarding her medicines.  The  following changes were made today: Increase diltiazem  Labs/ tests ordered today include:  Orders Placed This Encounter  Procedures  . EKG 12-Lead     Disposition:   FU with Kaitlan Bin 3 months  Signed, Nitish Roes Meredith Leeds, MD  12/02/2019 3:44 PM     [facility] [address] [place] [address] [phone] (office) [phone] (fax)

## 2019-12-15 ENCOUNTER — Ambulatory Visit: Payer: 59 | Admitting: Cardiology

## 2020-01-21 ENCOUNTER — Other Ambulatory Visit: Payer: Self-pay

## 2020-01-21 MED ORDER — APIXABAN 5 MG PO TABS: 5.0000 mg | ORAL_TABLET | Freq: Two times a day (BID) | ORAL | 3 refills | Status: DC

## 2020-01-21 MED ORDER — APIXABAN 5 MG PO TABS: 5.0000 mg | ORAL_TABLET | Freq: Two times a day (BID) | ORAL | 0 refills | Status: DC

## 2020-02-17 NOTE — Telephone Encounter (Signed)
Called pt.  Scheduled pt for follow up in [place] later this month, 2/28. Patient verbalized understanding and agreeable to plan.

## 2020-03-13 ENCOUNTER — Other Ambulatory Visit: Payer: Self-pay

## 2020-03-13 ENCOUNTER — Encounter: Payer: Self-pay | Admitting: Cardiology

## 2020-03-13 ENCOUNTER — Ambulatory Visit: Payer: 59 | Admitting: Cardiology

## 2020-03-13 VITALS — BP 142/96 | HR 121 | Ht 67.0 in | Wt 243.0 lb

## 2020-03-13 DIAGNOSIS — Z01812 Encounter for preprocedural laboratory examination: Secondary | ICD-10-CM | POA: Diagnosis not present

## 2020-03-13 DIAGNOSIS — I4819 Other persistent atrial fibrillation: Secondary | ICD-10-CM

## 2020-03-13 NOTE — Progress Notes (Signed)
Electrophysiology Office Note   Date:  03/13/2020   ID:  Shelby Kirk, DOB 04-26-1955, MRN [id number]  PCP:  Mayer Camel, NP  Cardiologist:  Bettina Gavia Primary Electrophysiologist:  Eden Toohey Meredith Leeds, MD    Chief Complaint: AF   History of Present Illness: Shelby Kirk is a 65 y.o. female who is being seen today for the evaluation of AF at the request of Brown-Patram, Marland Kitchen*. Presenting today for electrophysiology evaluation.  She has a history significant for hypertension, OSA, and paroxysmal atrial fibrillation.  She went into atrial fibrillation and was initially started on flecainide.  She did not wish to be cardioverted and flecainide was stopped.  She is felt much worse since then.  She has had rapid rates associated with fatigue, weakness, and shortness of breath.  She works at a [employer] and is quite active through the day.  When she gets home, her heart rate slows quite a bit and she has less fatigue at rest.  She does continue to get fatigued while active.  She also has palpitations.  Today, denies symptoms of palpitations, chest pain, shortness of breath, orthopnea, PND, lower extremity edema, claudication, dizziness, presyncope, syncope, bleeding, or neurologic sequela. The patient is tolerating medications without difficulties.  She continues to be in atrial fibrillation.  Her symptoms include weakness, fatigue, and shortness of breath.  She feels that her fatigue has been improving.  She is concerned that she may simply be getting used to being fatigued all the time.  She is ready for ablation at this point.   Past Medical History:  Diagnosis Date  . Adult situational stress disorder 04/26/2016  . Benign neoplasm of choroid 05/27/2011  . Chronic pain of multiple joints 10/13/2015  . Depression 10/14/2015  . Essential hypertension 04/26/2016  . Fatigue 09/23/2016  . Hypothyroidism 10/14/2015  . Malaise and fatigue 05/06/2019  . Menopausal flushing  05/06/2019  . Murmur, heart 09/23/2016  . MVP (mitral valve prolapse) 10/14/2015  . OSA (obstructive sleep apnea) 10/14/2015  . Palpitation 10/02/2014  . Rheumatoid arthritis ([place]) 09/23/2016  . Screening cholesterol level 04/26/2016   Past Surgical History:  Procedure Laterality Date  . BACK SURGERY    . CESAREAN SECTION    . CHOLECYSTECTOMY       Current Outpatient Medications  Medication Sig Dispense Refill  . apixaban (ELIQUIS) 5 MG TABS tablet Take 1 tablet (5 mg total) by mouth 2 (two) times daily. 180 tablet 3  . COMBIPATCH 0.05-0.14 MG/DAY 1 patch 2 (two) times a week.    . cyclobenzaprine (FLEXERIL) 10 MG tablet Take 10 mg by mouth daily as needed for muscle spasms.   11  . diltiazem (CARDIZEM CD) 240 MG 24 hr capsule Take 1 capsule (240 mg total) by mouth daily. 30 capsule 3  . levothyroxine (SYNTHROID, LEVOTHROID) 112 MCG tablet Take 112 mcg by mouth daily.    Marland Kitchen lisinopril (PRINIVIL,ZESTRIL) 40 MG tablet Take 40 mg by mouth daily.    . furosemide (LASIX) 20 MG tablet Take 20 mg by mouth as needed.     No current facility-administered medications for this visit.    Allergies:   Penicillins, Sulfa antibiotics, and Methotrexate   Social History:  The patient  reports that she has never smoked. She has never used smokeless tobacco. She reports that she does not drink alcohol and does not use drugs.   Family History:  The patient's family history includes Bone cancer in her father; CAD in her  father and mother; Heart attack in her maternal grandfather and maternal grandmother; Hyperlipidemia in her father and mother; Hypertension in her father and mother; Stroke in her maternal grandmother.   ROS:  Please see the history of present illness.   Otherwise, review of systems is positive for none.   All other systems are reviewed and negative.   PHYSICAL EXAM: VS:  BP (!) 142/96   Pulse (!) 121   Ht 5\' 7"  (1.702 m)   Wt 243 lb (110.2 kg)   BMI 38.06 kg/m  , BMI Body mass index is  38.06 kg/m. GEN: Well nourished, well developed, in no acute distress  HEENT: normal  Neck: no JVD, carotid bruits, or masses Cardiac: irregular; no murmurs, rubs, or gallops,no edema  Respiratory:  clear to auscultation bilaterally, normal work of breathing GI: soft, nontender, nondistended, + BS MS: no deformity or atrophy  Skin: warm and dry Neuro:  Strength and sensation are intact Psych: euthymic mood, full affect  EKG:  EKG is ordered today. Personal review of the ekg ordered shows atrial fibrillation, rate 121  Recent Labs: 07/26/2019: BUN 14; Creatinine, Ser 0.84; Hemoglobin 17.6; Magnesium 2.1; Platelets 227; Potassium 4.2; Sodium 142 09/08/2019: NT-Pro BNP 299; TSH 1.440    Lipid Panel  No results found for: CHOL, TRIG, HDL, CHOLHDL, VLDL, LDLCALC, LDLDIRECT   Wt Readings from Last 3 Encounters:  03/13/20 243 lb (110.2 kg)  12/02/19 239 lb 6.4 oz (108.6 kg)  09/16/19 238 lb 9.6 oz (108.2 kg)      Other studies Reviewed: Additional studies/ records that were reviewed today include: TTE 08/17/19  Review of the above records today demonstrates:  1. Left ventricular ejection fraction, by estimation, is 60 to 65%. The  left ventricle has normal function. The left ventricle has no regional  wall motion abnormalities. There is mild concentric left ventricular  hypertrophy. Left ventricular diastolic  parameters are indeterminate. The average left ventricular global  longitudinal strain is -9.6 %.  2. Right ventricular systolic function is normal. The right ventricular  size is normal. There is normal pulmonary artery systolic pressure.  3. The mitral valve is normal in structure. No evidence of mitral valve  regurgitation. No evidence of mitral stenosis.  4. The aortic valve is normal in structure. Aortic valve regurgitation is  not visualized. No aortic stenosis is present.  5. The inferior vena cava is normal in size with greater than 50%  respiratory  variability, suggesting right atrial pressure of 3 mmHg.   ASSESSMENT AND PLAN:  1.  Persistent atrial fibrillation: Currently on Eliquis, diltiazem with a CHA2DS2-VASc of 2.  She has significant atrial fibrillation symptoms including fatigue, shortness of breath.  She would likely benefit from ablation.  Risk and benefits were discussed which include bleeding, tamponade, heart block, stroke, damage to chest organs.  She understands these risks and is agreed to the procedure.  2.  Obstructive sleep apnea: CPAP compliance encouraged  3.  Hypertension: Elevated today.  We Drema Eddington hold off on making medication adjustments until post ablation.  Case discussed with primary cardiology  Current medicines are reviewed at length with the patient today.   The patient does not have concerns regarding her medicines.  The following changes were made today: None  Labs/ tests ordered today include:  Orders Placed This Encounter  Procedures  . CT CARDIAC MORPH/PULM VEIN W/CM&W/O CA SCORE  . Basic metabolic panel  . CBC  . EKG 12-Lead     Disposition:  FU with Carra Brindley 3 months  Signed, Fannye Myer Meredith Leeds, MD  03/13/2020 6:04 PM     [place] [address] [phone] (office) [phone] (fax)

## 2020-03-13 NOTE — Patient Instructions (Signed)
Medication Instructions:  Your physician recommends that you continue on your current medications as directed. Please refer to the Current Medication list given to you today.  *If you need a refill on your cardiac medications before your next appointment, please call your pharmacy*   Lab Work: Pre procedure labs between: 3/8 - 3/25.   You can stop by the [facility] office anytime, avoid lunch hours.  You do NOT need to be fasting.  If you have labs (blood work) drawn today and your tests are completely normal, you will receive your results only by: Marland Kitchen MyChart Message (if you have MyChart) OR . A paper copy in the mail If you have any lab test that is abnormal or we need to change your treatment, we will call you to review the results.   Testing/Procedures: Your physician has requested that you have cardiac CT within 7 days PRIOR to your ablation. Cardiac computed tomography (CT) is a painless test that uses an x-ray machine to take clear, detailed pictures of your heart. Please see the instructions below located under "other instructions".    Your physician has recommended that you have an ablation. Catheter ablation is a medical procedure used to treat some cardiac arrhythmias (irregular heartbeats). During catheter ablation, a long, thin, flexible tube is put into a blood vessel in your groin (upper thigh), or neck. This tube is called an ablation catheter. It is then guided to your heart through the blood vessel. Radio frequency waves destroy small areas of heart tissue where abnormal heartbeats may cause an arrhythmia to start. Please see the instructions below located under "other instructions".    Follow-Up: At [facility], you and your health needs are our priority.  As part of our continuing mission to provide you with exceptional heart care, we have created designated Provider Care Teams.  These Care Teams include your primary Cardiologist (physician) and Advanced Practice Providers  (APPs -  Physician Assistants and Nurse Practitioners) who all work together to provide you with the care you need, when you need it.   Your next appointment:   4 week(s)  The format for your next appointment:   In Person  Provider:   You will follow up in the [facility] located at [facility]. Your provider will be: Roderic Palau, NP or Clint R. Fenton, PA-C    Thank you for choosing [facility]!!   Trinidad Curet, RN [phone]   Other Instructions  CT INSTRUCTIONS Your cardiac CT will be scheduled at:  [facility] [address] [place], [place] [address] [phone]  Please arrive at the [facility] main entrance of [facility] 30 minutes prior to test start time. Proceed to the [facility] Radiology Department (first floor) to check-in and test prep.  Please follow these instructions carefully (unless otherwise directed):  Hold all erectile dysfunction medications at least 3 days (72 hrs) prior to test.  On the Night Before the Test: . Be sure to Drink plenty of water. . Do not consume any caffeinated/decaffeinated beverages or chocolate 12 hours prior to your test. . Do not take any antihistamines 12 hours prior to your test.  On the Day of the Test: . Drink plenty of water. Do not drink any water within one hour of the test. . Do not eat any food 4 hours prior to the test. . You may take your regular medications prior to the test.  . Take metoprolol (Lopressor) two hours prior to  test. . HOLD Furosemide/Hydrochlorothiazide morning of the test. . FEMALES- please wear underwire-free bra if available      After the Test: . Drink plenty of water. . After receiving IV contrast, you may experience a mild flushed feeling. This is normal. . On occasion, you may experience a mild rash up to 24 hours after the test. This is not dangerous. If this occurs, you can take Benadryl 25 mg and increase your fluid  intake. . If you experience trouble breathing, this can be serious. If it is severe call 911 IMMEDIATELY. If it is mild, please call our office. . If you take any of these medications: Glipizide/Metformin, Avandament, Glucavance, please do not take 48 hours after completing test unless otherwise instructed.   Once we have confirmed authorization from your insurance company, we will call you to set up a date and time for your test. Based on how quickly your insurance processes prior authorizations requests, please allow up to 4 weeks to be contacted for scheduling your Cardiac CT appointment. Be advised that routine Cardiac CT appointments could be scheduled as many as 8 weeks after your provider has ordered it.  For non-scheduling related questions, please contact the cardiac imaging nurse navigator should you have any questions/concerns: Marchia Bond, Cardiac Imaging Nurse Navigator Burley Saver, Interim Cardiac Imaging Nurse [facility] Direct Office Dial: [phone]   For scheduling needs, including cancellations and rescheduling, please call [place], [phone].     Electrophysiology/Ablation Procedure Instructions   You are scheduled for a(n)  ablation on 04/20/2019 with Dr. Allegra Lai.   1.   Pre procedure testing-             A.  LAB WORK --- between 3/8 - 3/25  for your pre procedure blood work.  You do NOT need to be fasting, you can stop by the [facility] office.               B. COVID TEST-- On 04/17/2020 @ 2:00 pm - This is a Drive Up Visit at [address] [place], [place] [address].  Someone will direct you to the appropriate testing line. Stay in your car and someone will be with you shortly.   After you are tested please go home and self quarantine until the day of your procedure.     PROCEDURE DAY: 2. On the day of your procedure 04/19/2020 you will go to [facility] [phone]) at 9:30 am.  Dennis Bast will go to the main  entrance A [employer]) and enter where the [employer] are.  Your driver will drop you off and you will head down the hallway to ADMITTING.  You may have one support person come in to the hospital with you.  They will be asked to wait in the waiting room.  It is OK to have someone drop you off and come back when you are ready to be discharged.   3.   Do not eat or drink after midnight prior to your procedure.   4.   Do not miss any doses of your blood thinner prior to the morning of your procedure or your procedure will need to be rescheduled.       Do NOT take any medications the morning of your procedure.   5.  Plan for an overnight stay, but you may be discharged home after your procedure. If you use your phone frequently bring your phone charger, in case  you have to stay.  If you are discharged after your procedure you will need someone to drive you home and be with your for 24 hours after your procedure.   6. You will follow up with the AFIB clinic 4 weeks after your procedure.  You will follow up with Dr. Curt Bears  3 months after your procedure.  These appointments will be made for you.   * If you have ANY questions please call the office [phone] and ask for Laguana Desautel RN or send me a MyChart message   * Occasionally, EP Studies and ablations can become lengthy.  Please make your family aware of this before your procedure starts.  Average time ranges from 2-8 hours for EP studies/ablations.  Your physician will call your family after the procedure with the results.                                    Cardiac Ablation Cardiac ablation is a procedure to destroy (ablate) some heart tissue that is sending bad signals. These bad signals cause problems in heart rhythm. The heart has many areas that make these signals. If there are problems in these areas, they can make the heart beat in a way that is not normal. Destroying some tissues can help make the heart rhythm normal. Tell your  doctor about:  Any allergies you have.  All medicines you are taking. These include vitamins, herbs, eye drops, creams, and over-the-counter medicines.  Any problems you or family members have had with medicines that make you fall asleep (anesthetics).  Any blood disorders you have.  Any surgeries you have had.  Any medical conditions you have, such as kidney failure.  Whether you are pregnant or may be pregnant. What are the risks? This is a safe procedure. But problems may occur, including:  Infection.  Bruising and bleeding.  Bleeding into the chest.  Stroke or blood clots.  Damage to nearby areas of your body.  Allergies to medicines or dyes.  The need for a pacemaker if the normal system is damaged.  Failure of the procedure to treat the problem. What happens before the procedure? Medicines Ask your doctor about:  Changing or stopping your normal medicines. This is important.  Taking aspirin and ibuprofen. Do not take these medicines unless your doctor tells you to take them.  Taking other medicines, vitamins, herbs, and supplements. General instructions  Follow instructions from your doctor about what you cannot eat or drink.  Plan to have someone take you home from the hospital or clinic.  If you will be going home right after the procedure, plan to have someone with you for 24 hours.  Ask your doctor what steps will be taken to prevent infection. What happens during the procedure?  An IV tube will be put into one of your veins.  You will be given a medicine to help you relax.  The skin on your neck or groin will be numbed.  A cut (incision) will be made in your neck or groin. A needle will be put through your cut and into a large vein.  A tube (catheter) will be put into the needle. The tube will be moved to your heart.  Dye may be put through the tube. This helps your doctor see your heart.  Small devices (electrodes) on the tube will send out  signals.  A type of energy will be  used to destroy some heart tissue.  The tube will be taken out.  Pressure will be held on your cut. This helps stop bleeding.  A bandage will be put over your cut. The exact procedure may vary among doctors and hospitals.   What happens after the procedure?  You will be watched until you leave the hospital or clinic. This includes checking your heart rate, breathing rate, oxygen, and blood pressure.  Your cut will be watched for bleeding. You will need to lie still for a few hours.  Do not drive for 24 hours or as long as your doctor tells you. Summary  Cardiac ablation is a procedure to destroy some heart tissue. This is done to treat heart rhythm problems.  Tell your doctor about any medical conditions you may have. Tell him or her about all medicines you are taking to treat them.  This is a safe procedure. But problems may occur. These include infection, bruising, bleeding, and damage to nearby areas of your body.  Follow what your doctor tells you about food and drink. You may also be told to change or stop some of your medicines.  After the procedure, do not drive for 24 hours or as long as your doctor tells you. This information is not intended to replace advice given to you by your health care provider. Make sure you discuss any questions you have with your health care provider. Document Revised: 12/03/2018 Document Reviewed: 12/03/2018 Elsevier Patient Education  2021 [employer].

## 2020-03-20 ENCOUNTER — Telehealth: Payer: Self-pay | Admitting: Cardiology

## 2020-03-20 NOTE — Telephone Encounter (Signed)
Pt aware 4/6 is not available to procedure. A few dates in April given to pt as rescheduling options -- she is going to let me know after she speaks w/ her office.

## 2020-03-20 NOTE — Telephone Encounter (Signed)
Patient states she is returning Sherri's call.  

## 2020-03-24 ENCOUNTER — Other Ambulatory Visit: Payer: Self-pay | Admitting: Cardiology

## 2020-03-27 MED ORDER — METOPROLOL TARTRATE 100 MG PO TABS
ORAL_TABLET | ORAL | 0 refills | Status: DC
Start: 2020-03-27 — End: 2020-05-05

## 2020-03-27 MED ORDER — DILTIAZEM HCL ER COATED BEADS 240 MG PO CP24: 240.0000 mg | ORAL_CAPSULE | Freq: Every day | ORAL | 2 refills | Status: DC

## 2020-03-27 NOTE — Addendum Note (Signed)
Addended by: Stanton Kidney on: 03/27/2020 10:46 AM   Modules accepted: Orders

## 2020-03-27 NOTE — Telephone Encounter (Signed)
Spoke to pt about ablation and aware I would take care of this for her.    Shelby Kirk, I sent in Rx

## 2020-03-27 NOTE — Telephone Encounter (Signed)
Pt agreeable to ablation date of 4/29. Reviewed instructions briefly, answered all questions, aware will send instructions via mychart. Aware office will call to arrange CT testing and post procedure follow up.  Rx sent in for pt to take Lopressor 100 mg two hours prior to CT, pt aware. She will stop by [facility] office 4/20 for pre procedure blood work. Patient verbalized understanding and agreeable to plan.

## 2020-04-13 ENCOUNTER — Telehealth: Payer: Self-pay | Admitting: Cardiology

## 2020-04-13 NOTE — Telephone Encounter (Signed)
Pt c/o swelling: STAT is pt has developed SOB within 24 hours  1) How much weight have you gained and in what time span?  10-15 lbs in about 3 weeks   2) If swelling, where is the swelling located? From the knees down   3) Are you currently taking a fluid pill? No   4) Are you currently SOB? No   5) Do you have a log of your daily weights (if so, list)? No   6) Have you gained 3 pounds in a day or 5 pounds in a week? Patient states she gained 10-15 lbs in about 3 weeks  7) Have you traveled recently? No   Pt c/o medication issue:  1. Name of Medication: diltiazem (CARDIZEM CD) 240 MG 24 hr capsule  2. How are you currently taking this medication (dosage and times per day)? 1 capsule (240 mg total) by mouth daily  3. Are you having a reaction (difficulty breathing--STAT)? Yes   4. What is your medication issue? Patient assumes this medication is causing the swelling

## 2020-04-13 NOTE — Telephone Encounter (Signed)
Spoke with patient she states "I know this medication can make you swell" I injured my leg at work last week and was put on 2 antibiotics by Dr. Jimmye Norman ,cipro and clindamycin, she has been taking for a little over a week. "My leg is just not healing, it's very red and infected." She states the swelling is from her knees down and is painful but is more so pain at the ankles.When she props her feet up and swelling gets better and she uses the bathroom more. She took Lasix 40 mg yesterday. She states she was told to stop Cardizem by Dr. Bettina Gavia last night. She wanted to speak with Sherri, Dr. Curt Bears nurse since they have spoken several times before. She says her heart rate alters from day to day, depending on her stress level.    Ultimately she wants to know does she stay off of the Cardizem and she does not want to delay her Ablation set on April 29?

## 2020-04-14 NOTE — Telephone Encounter (Signed)
Left message to call back  

## 2020-04-14 NOTE — Telephone Encounter (Signed)
Ok to hold. Would monitor leg and if remains an issue may need to adjust timing of ablation.

## 2020-04-14 NOTE — Telephone Encounter (Signed)
Forwarding to Dr. Curt Bears to review/advise. Dr. Curt Bears,  Dr Bettina Gavia advised pt to stop Diltiazem Wednesday as this may be cause (you can refer to mychart message from 3/29). Pt calling in and wanted you to review prior to me calling pt back.

## 2020-04-17 ENCOUNTER — Other Ambulatory Visit (HOSPITAL_COMMUNITY): Payer: 59

## 2020-04-17 NOTE — Telephone Encounter (Signed)
Called pt. She reports swelling stopped/disappeared after stopping the Diltiazem. Pt is not sure what her HRs are running but she thinks they are elevated.  Pt is going to wear her apple watch tomorrow and let me know. May discuss med options w/ Cam[place] until such time as ablation can be performed.  Pt would prefer this and will send message tomorrow afternoon/evening about her HRs.

## 2020-05-03 ENCOUNTER — Telehealth (HOSPITAL_COMMUNITY): Payer: Self-pay | Admitting: Emergency Medicine

## 2020-05-03 NOTE — Telephone Encounter (Signed)
Attempted to call patient regarding upcoming cardiac CT appointment. °Left message on voicemail with name and callback number °Mitzie Marlar RN Navigator Cardiac Imaging °[place] [facility] °[phone] Office °[phone] Cell ° °

## 2020-05-04 ENCOUNTER — Telehealth (HOSPITAL_COMMUNITY): Payer: Self-pay | Admitting: *Deleted

## 2020-05-04 LAB — CBC
Hematocrit: 47.4 % — ABNORMAL HIGH (ref 34.0–46.6)
Hemoglobin: 15.9 g/dL (ref 11.1–15.9)
MCH: 30.8 pg (ref 26.6–33.0)
MCHC: 33.5 g/dL (ref 31.5–35.7)
MCV: 92 fL (ref 79–97)
Platelets: 214 10*3/uL (ref 150–450)
RBC: 5.17 x10E6/uL (ref 3.77–5.28)
RDW: 12.8 % (ref 11.7–15.4)
WBC: 7.7 10*3/uL (ref 3.4–10.8)

## 2020-05-04 LAB — BASIC METABOLIC PANEL
BUN/Creatinine Ratio: 18 (ref 12–28)
BUN: 13 mg/dL (ref 8–27)
CO2: 20 mmol/L (ref 20–29)
Calcium: 9.4 mg/dL (ref 8.7–10.3)
Chloride: 103 mmol/L (ref 96–106)
Creatinine, Ser: 0.74 mg/dL (ref 0.57–1.00)
Glucose: 86 mg/dL (ref 65–99)
Potassium: 4.1 mmol/L (ref 3.5–5.2)
Sodium: 142 mmol/L (ref 134–144)
eGFR: 91 mL/min/{1.73_m2} (ref >59)

## 2020-05-04 NOTE — Telephone Encounter (Signed)
Pt returning call regarding upcoming cardiac imaging study; pt verbalizes understanding of appt date/time, parking situation and where to check in, pre-test NPO status and medications ordered, and verified current allergies; name and call back number provided for further questions should they arise  Gordy Clement RN Navigator Cardiac Imaging [facility] [phone] office [phone] cell  Pt to take 100mg  metoprolol tartrate 2 hours prior to cardiac CT scan.

## 2020-05-05 ENCOUNTER — Other Ambulatory Visit: Payer: Self-pay

## 2020-05-05 ENCOUNTER — Ambulatory Visit (HOSPITAL_COMMUNITY)
Admission: RE | Admit: 2020-05-05 | Discharge: 2020-05-05 | Disposition: A | Discharge status: Home / Self Care | Payer: 59 | Source: Ambulatory Visit | Attending: Cardiology | Admitting: Cardiology

## 2020-05-05 ENCOUNTER — Other Ambulatory Visit (HOSPITAL_COMMUNITY): Payer: Self-pay | Admitting: *Deleted

## 2020-05-05 ENCOUNTER — Encounter (HOSPITAL_COMMUNITY): Payer: Self-pay

## 2020-05-05 DIAGNOSIS — I4819 Other persistent atrial fibrillation: Secondary | ICD-10-CM

## 2020-05-05 MED ORDER — METOPROLOL TARTRATE 100 MG PO TABS: ORAL_TABLET | ORAL | 0 refills | Status: DC

## 2020-05-05 NOTE — Progress Notes (Signed)
Gordy Clement RN called re patient being in rapid atrial fib at rate of 115-124 despite 100 mg of Metoprolol at 1130. CT scan cancelled and Horald Pollen RN will notify Dr Radford Pax of same and that scan needs to be rescheduled or different test ordered. Patient aware. Discharged walking with husband.

## 2020-05-08 ENCOUNTER — Other Ambulatory Visit: Payer: Self-pay

## 2020-05-08 ENCOUNTER — Ambulatory Visit (HOSPITAL_COMMUNITY)
Admission: RE | Admit: 2020-05-08 | Discharge: 2020-05-08 | Disposition: A | Discharge status: Home / Self Care | Payer: 59 | Source: Ambulatory Visit | Attending: Cardiology | Admitting: Cardiology

## 2020-05-08 DIAGNOSIS — I4819 Other persistent atrial fibrillation: Secondary | ICD-10-CM | POA: Insufficient documentation

## 2020-05-08 MED ORDER — METOPROLOL TARTRATE 5 MG/5ML IV SOLN
10.0000 mg | Freq: Once | INTRAVENOUS | Status: AC
  Administered 2020-05-08: 10 mg via INTRAVENOUS

## 2020-05-08 MED ORDER — METOPROLOL TARTRATE 5 MG/5ML IV SOLN
INTRAVENOUS | Status: AC
  Filled 2020-05-08: qty 5

## 2020-05-08 MED ORDER — IOHEXOL 350 MG/ML SOLN
80.0000 mL | Freq: Once | INTRAVENOUS | Status: AC | PRN
  Administered 2020-05-08: 80 mL via INTRAVENOUS

## 2020-05-08 NOTE — Progress Notes (Signed)
CT scan completed. Tolerate dwll. D/C home ambulatory. Awake and alert. In no distress

## 2020-05-10 ENCOUNTER — Other Ambulatory Visit (HOSPITAL_COMMUNITY)
Admission: RE | Admit: 2020-05-10 | Discharge: 2020-05-10 | Disposition: A | Discharge status: Home / Self Care | Payer: 59 | Source: Ambulatory Visit | Attending: Cardiology | Admitting: Cardiology

## 2020-05-10 DIAGNOSIS — Z20822 Contact with and (suspected) exposure to covid-19: Secondary | ICD-10-CM | POA: Insufficient documentation

## 2020-05-10 DIAGNOSIS — Z01812 Encounter for preprocedural laboratory examination: Secondary | ICD-10-CM | POA: Insufficient documentation

## 2020-05-10 LAB — SARS CORONAVIRUS 2 (TAT 6-24 HRS): SARS Coronavirus 2: NEGATIVE

## 2020-05-12 ENCOUNTER — Ambulatory Visit (HOSPITAL_COMMUNITY)
Admission: RE | Admit: 2020-05-12 | Discharge: 2020-05-12 | Disposition: A | Discharge status: Home / Self Care | Payer: 59 | Source: Ambulatory Visit | Attending: Cardiology | Admitting: Cardiology

## 2020-05-12 ENCOUNTER — Encounter (HOSPITAL_COMMUNITY)
Admission: RE | Disposition: A | Discharge status: Home / Self Care | Payer: Self-pay | Source: Ambulatory Visit | Attending: Cardiology

## 2020-05-12 ENCOUNTER — Ambulatory Visit (HOSPITAL_COMMUNITY): Payer: 59 | Admitting: Anesthesiology

## 2020-05-12 ENCOUNTER — Other Ambulatory Visit: Payer: Self-pay

## 2020-05-12 DIAGNOSIS — I4819 Other persistent atrial fibrillation: Secondary | ICD-10-CM | POA: Insufficient documentation

## 2020-05-12 DIAGNOSIS — I10 Essential (primary) hypertension: Secondary | ICD-10-CM | POA: Insufficient documentation

## 2020-05-12 DIAGNOSIS — Z882 Allergy status to sulfonamides status: Secondary | ICD-10-CM | POA: Insufficient documentation

## 2020-05-12 DIAGNOSIS — G4733 Obstructive sleep apnea (adult) (pediatric): Secondary | ICD-10-CM | POA: Insufficient documentation

## 2020-05-12 DIAGNOSIS — Z880 Allergy status to penicillin: Secondary | ICD-10-CM | POA: Insufficient documentation

## 2020-05-12 HISTORY — PX: ATRIAL FIBRILLATION ABLATION: EP1191

## 2020-05-12 LAB — POCT ACTIVATED CLOTTING TIME
Activated Clotting Time: 333 seconds
Activated Clotting Time: 327 seconds

## 2020-05-12 SURGERY — ATRIAL FIBRILLATION ABLATION
Anesthesia: General

## 2020-05-12 MED ORDER — OXYCODONE HCL 5 MG/5ML PO SOLN
5.0000 mg | Freq: Once | ORAL | Status: AC | PRN
Start: 2020-05-12 — End: 2020-05-12

## 2020-05-12 MED ORDER — HEPARIN SODIUM (PORCINE) 1000 UNIT/ML IJ SOLN
INTRAMUSCULAR | Status: AC
  Filled 2020-05-12: qty 1

## 2020-05-12 MED ORDER — SODIUM CHLORIDE 0.9% FLUSH: 3.0000 mL | INTRAVENOUS | Status: DC | PRN

## 2020-05-12 MED ORDER — PROTAMINE SULFATE 10 MG/ML IV SOLN
INTRAVENOUS | Status: DC | PRN
  Administered 2020-05-12: 40 mg via INTRAVENOUS

## 2020-05-12 MED ORDER — LIDOCAINE 2% (20 MG/ML) 5 ML SYRINGE
INTRAMUSCULAR | Status: DC | PRN
  Administered 2020-05-12: 100 mg via INTRAVENOUS

## 2020-05-12 MED ORDER — HEPARIN SODIUM (PORCINE) 1000 UNIT/ML IJ SOLN
INTRAMUSCULAR | Status: DC | PRN
  Administered 2020-05-12: 1000 [IU] via INTRAVENOUS

## 2020-05-12 MED ORDER — PHENYLEPHRINE HCL-NACL 10-0.9 MG/250ML-% IV SOLN
INTRAVENOUS | Status: DC | PRN
  Administered 2020-05-12: 25 ug/min via INTRAVENOUS

## 2020-05-12 MED ORDER — ACETAMINOPHEN 325 MG PO TABS: 650.0000 mg | ORAL_TABLET | ORAL | Status: DC | PRN

## 2020-05-12 MED ORDER — HYDROMORPHONE HCL 1 MG/ML IJ SOLN: 0.2500 mg | INTRAMUSCULAR | Status: DC | PRN

## 2020-05-12 MED ORDER — FENTANYL CITRATE (PF) 100 MCG/2ML IJ SOLN
INTRAMUSCULAR | Status: DC | PRN
  Administered 2020-05-12: 100 ug via INTRAVENOUS

## 2020-05-12 MED ORDER — SODIUM CHLORIDE 0.9 % IV SOLN: 250.0000 mL | INTRAVENOUS | Status: DC | PRN

## 2020-05-12 MED ORDER — OXYCODONE HCL 5 MG PO TABS
5.0000 mg | ORAL_TABLET | Freq: Once | ORAL | Status: AC | PRN
  Administered 2020-05-12: 5 mg via ORAL

## 2020-05-12 MED ORDER — HEPARIN (PORCINE) IN NACL 1000-0.9 UT/500ML-% IV SOLN
INTRAVENOUS | Status: AC
  Filled 2020-05-12: qty 500

## 2020-05-12 MED ORDER — HEPARIN SODIUM (PORCINE) 1000 UNIT/ML IJ SOLN
INTRAMUSCULAR | Status: DC | PRN
  Administered 2020-05-12: 2000 [IU] via INTRAVENOUS
  Administered 2020-05-12: 15000 [IU] via INTRAVENOUS

## 2020-05-12 MED ORDER — DOBUTAMINE IN D5W 4-5 MG/ML-% IV SOLN
INTRAVENOUS | Status: DC | PRN
  Administered 2020-05-12: 20 ug/kg/min via INTRAVENOUS

## 2020-05-12 MED ORDER — MIDAZOLAM HCL 5 MG/5ML IJ SOLN
INTRAMUSCULAR | Status: DC | PRN
  Administered 2020-05-12: 2 mg via INTRAVENOUS

## 2020-05-12 MED ORDER — SUGAMMADEX SODIUM 200 MG/2ML IV SOLN
INTRAVENOUS | Status: DC | PRN
  Administered 2020-05-12 (×2): 100 mg via INTRAVENOUS

## 2020-05-12 MED ORDER — ROCURONIUM BROMIDE 10 MG/ML (PF) SYRINGE
PREFILLED_SYRINGE | INTRAVENOUS | Status: DC | PRN
  Administered 2020-05-12: 100 mg via INTRAVENOUS

## 2020-05-12 MED ORDER — ONDANSETRON HCL 4 MG/2ML IJ SOLN: 4.0000 mg | Freq: Four times a day (QID) | INTRAMUSCULAR | Status: DC | PRN

## 2020-05-12 MED ORDER — ONDANSETRON HCL 4 MG/2ML IJ SOLN
INTRAMUSCULAR | Status: DC | PRN
  Administered 2020-05-12: 4 mg via INTRAVENOUS

## 2020-05-12 MED ORDER — SODIUM CHLORIDE 0.9% FLUSH: 3.0000 mL | Freq: Two times a day (BID) | INTRAVENOUS | Status: DC

## 2020-05-12 MED ORDER — DEXAMETHASONE SODIUM PHOSPHATE 10 MG/ML IJ SOLN
INTRAMUSCULAR | Status: DC | PRN
  Administered 2020-05-12: 10 mg via INTRAVENOUS

## 2020-05-12 MED ORDER — PROMETHAZINE HCL 25 MG/ML IJ SOLN
6.2500 mg | INTRAMUSCULAR | Status: DC | PRN
  Filled 2020-05-12: qty 1

## 2020-05-12 MED ORDER — SODIUM CHLORIDE 0.9 % IV SOLN: INTRAVENOUS | Status: DC

## 2020-05-12 MED ORDER — OXYCODONE HCL 5 MG PO TABS
ORAL_TABLET | ORAL | Status: AC
  Filled 2020-05-12: qty 1

## 2020-05-12 MED ORDER — PROPOFOL 10 MG/ML IV BOLUS
INTRAVENOUS | Status: DC | PRN
  Administered 2020-05-12: 200 mg via INTRAVENOUS

## 2020-05-12 MED ORDER — DOBUTAMINE IN D5W 4-5 MG/ML-% IV SOLN
INTRAVENOUS | Status: AC
  Filled 2020-05-12: qty 250

## 2020-05-12 MED ORDER — HEPARIN (PORCINE) IN NACL 1000-0.9 UT/500ML-% IV SOLN
INTRAVENOUS | Status: DC | PRN
  Administered 2020-05-12 (×5): 500 mL

## 2020-05-12 SURGICAL SUPPLY — 20 items
BLANKET WARM UNDERBOD FULL ACC (MISCELLANEOUS) ×2 IMPLANT
CATH MAPPNG PENTARAY F 2-6-2MM (CATHETERS) IMPLANT
CATH S CIRCA THERM PROBE 10F (CATHETERS) ×1 IMPLANT
CATH SMTCH THERMOCOOL SF DF (CATHETERS) ×1 IMPLANT
CATH SOUNDSTAR ECO 8FR (CATHETERS) ×1 IMPLANT
CATH WEBSTER BI DIR CS D-F CRV (CATHETERS) ×1 IMPLANT
CLOSURE PERCLOSE PROSTYLE (VASCULAR PRODUCTS) ×4 IMPLANT
COVER SWIFTLINK CONNECTOR (BAG) ×2 IMPLANT
KIT VERSACROSS STEERABLE D1 (CATHETERS) ×1 IMPLANT
MAT PREVALON FULL STRYKER (MISCELLANEOUS) ×1 IMPLANT
PACK EP LATEX FREE (CUSTOM PROCEDURE TRAY) ×2
PACK EP LF (CUSTOM PROCEDURE TRAY) ×1 IMPLANT
PAD PRO RADIOLUCENT 2001M-C (PAD) ×2 IMPLANT
PATCH CARTO3 (PAD) ×1 IMPLANT
PENTARAY F 2-6-2MM (CATHETERS) ×2
SHEATH CARTO VIZIGO SM CVD (SHEATH) ×1 IMPLANT
SHEATH PINNACLE 7F 10CM (SHEATH) ×1 IMPLANT
SHEATH PINNACLE 8F 10CM (SHEATH) ×2 IMPLANT
SHEATH PINNACLE 9F 10CM (SHEATH) ×1 IMPLANT
TUBING SMART ABLATE COOLFLOW (TUBING) ×1 IMPLANT

## 2020-05-12 NOTE — Transfer of Care (Signed)
Immediate Anesthesia Transfer of Care Note  Patient: Shelby Kirk  Procedure(s) Performed: ATRIAL FIBRILLATION ABLATION (N/A )  Patient Location: Cath Lab  Anesthesia Type:General  Level of Consciousness: awake, alert  and oriented  Airway & Oxygen Therapy: Patient connected to nasal cannula oxygen  Post-op Assessment: Post -op Vital signs reviewed and stable  Post vital signs: stable  Last Vitals:  Vitals Value Taken Time  BP 121/76 05/12/20 1423  Temp 36.4 C 05/12/20 1420  Pulse 89 05/12/20 1424  Resp 17 05/12/20 1424  SpO2 95 % 05/12/20 1424  Vitals shown include unvalidated device data.  Last Pain:  Vitals:   05/12/20 1420  TempSrc: Temporal  PainSc: 0-No pain      Patients Stated Pain Goal: 3 (58/52/77 8242)  Complications: No complications documented.

## 2020-05-12 NOTE — Discharge Instructions (Signed)
Post procedure care instructions No driving for 4 days. No lifting over 5 lbs for 1 week. No vigorous or sexual activity for 1 week. You may return to work/your usual activities on 5//7/22. Keep procedure site clean & dry. If you notice increased pain, swelling, bleeding or pus, call/return!  You may shower after 24 hours, but no soaking in baths/hot tubs/pools for 1 week.   You have an appointment set up with the [facility].  Multiple studies have shown that being followed by a dedicated atrial fibrillation clinic in addition to the standard care you receive from your other physicians improves health. We believe that enrollment in the atrial fibrillation clinic will allow [place] to better care for you.   The phone number to the [facility] is [phone]. The clinic is staffed Monday through Friday from 8:30am to 5pm.  Parking Directions: The clinic is located in the Heart and Vascular Building connected to [facility]. 1)From [address] turn on to [address] and go to the 3rd entrance  (Heart and Vascular entrance) on the right. 2)Look to the right for Heart &Vascular Parking Garage. 3)A code for the entrance is required, for may is 3211 4)Take the elevators to the 1st floor. Registration is in the room with the glass walls at the end of the hallway.  If you have any trouble parking or locating the clinic, please don't hesitate to call [phone].    Femoral Site Care  This sheet gives you information about how to care for yourself after your procedure. Your health care provider may also give you more specific instructions. If you have problems or questions, contact your health care provider. What can I expect after the procedure? After the procedure, it is common to have:  Bruising that usually fades within 1-2 weeks.  Tenderness at the site. Follow these instructions at home: Wound care  Follow instructions from your health care provider  about how to take care of your insertion site. Make sure you: ? Wash your hands with soap and water before you change your bandage (dressing). If soap and water are not available, use hand sanitizer. ? Change your dressing as told by your health care provider. ? Leave stitches (sutures), skin glue, or adhesive strips in place. These skin closures may need to stay in place for 2 weeks or longer. If adhesive strip edges start to loosen and curl up, you may trim the loose edges. Do not remove adhesive strips completely unless your health care provider tells you to do that.  Do not take baths, swim, or use a hot tub until your health care provider approves.  You may shower 24-48 hours after the procedure or as told by your health care provider. ? Gently wash the site with plain soap and water. ? Pat the area dry with a clean towel. ? Do not rub the site. This may cause bleeding.  Do not apply powder or lotion to the site. Keep the site clean and dry.  Check your femoral site every day for signs of infection. Check for: ? Redness, swelling, or pain. ? Fluid or blood. ? Warmth. ? Pus or a bad smell. Activity  For the first 2-3 days after your procedure, or as long as directed: ? Avoid climbing stairs as much as possible. ? Do not squat.  Do not lift anything that is heavier than 10 lb (4.5 kg), or the limit that you are told, until your health care provider says that  it is safe.  Rest as directed. ? Avoid sitting for a long time without moving. Get up to take short walks every 1-2 hours.  Do not drive for 24 hours if you were given a medicine to help you relax (sedative). General instructions  Take over-the-counter and prescription medicines only as told by your health care provider.  Keep all follow-up visits as told by your health care provider. This is important. Contact a health care provider if you have:  A fever or chills.  You have redness, swelling, or pain around your  insertion site. Get help right away if:  The catheter insertion area swells very fast.  You pass out.  You suddenly start to sweat or your skin gets clammy.  The catheter insertion area is bleeding, and the bleeding does not stop when you hold steady pressure on the area.  The area near or just beyond the catheter insertion site becomes pale, cool, tingly, or numb. These symptoms may represent a serious problem that is an emergency. Do not wait to see if the symptoms will go away. Get medical help right away. Call your local emergency services (911 in the [place] Do not drive yourself to the hospital. Summary  After the procedure, it is common to have bruising that usually fades within 1-2 weeks.  Check your femoral site every day for signs of infection.  Do not lift anything that is heavier than 10 lb (4.5 kg), or the limit that you are told, until your health care provider says that it is safe. This information is not intended to replace advice given to you by your health care provider. Make sure you discuss any questions you have with your health care provider. Document Revised: 09/03/2019 Document Reviewed: 09/03/2019 Elsevier Patient Education  [employer].

## 2020-05-12 NOTE — H&P (Signed)
Electrophysiology Office Note   Date:  05/12/2020   ID:  BALJIT LIEBERT, DOB 1955-08-31, MRN [id number]  PCP:  Mayer Camel, NP  Cardiologist:  Bettina Gavia Primary Electrophysiologist:  Tzippy Testerman Meredith Leeds, MD    Chief Complaint: AF   History of Present Illness: Shelby Kirk is a 65 y.o. female who is being seen today for the evaluation of AF at the request of No ref. provider found. Presenting today for electrophysiology evaluation.  She has a history significant for hypertension, OSA, and paroxysmal atrial fibrillation.  She went into atrial fibrillation and was initially started on flecainide.  She did not wish to be cardioverted and flecainide was stopped.  She is felt much worse since then.  She has had rapid rates associated with fatigue, weakness, and shortness of breath.  She works at a [employer] and is quite active through the day.  When she gets home, her heart rate slows quite a bit and she has less fatigue at rest.  She does continue to get fatigued while active.  She also has palpitations.  Today, denies symptoms of palpitations, chest pain, shortness of breath, orthopnea, PND, lower extremity edema, claudication, dizziness, presyncope, syncope, bleeding, or neurologic sequela. The patient is tolerating medications without difficulties. Plan ablationtoday.     Past Medical History:  Diagnosis Date  . Adult situational stress disorder 04/26/2016  . Benign neoplasm of choroid 05/27/2011  . Chronic pain of multiple joints 10/13/2015  . Depression 10/14/2015  . Essential hypertension 04/26/2016  . Fatigue 09/23/2016  . Hypothyroidism 10/14/2015  . Malaise and fatigue 05/06/2019  . Menopausal flushing 05/06/2019  . Murmur, heart 09/23/2016  . MVP (mitral valve prolapse) 10/14/2015  . OSA (obstructive sleep apnea) 10/14/2015  . Palpitation 10/02/2014  . Rheumatoid arthritis ([place]) 09/23/2016  . Screening cholesterol level 04/26/2016   Past Surgical History:   Procedure Laterality Date  . BACK SURGERY    . CESAREAN SECTION    . CHOLECYSTECTOMY       Current Facility-Administered Medications  Medication Dose Route Frequency Provider Last Rate Last Admin  . 0.9 %  sodium chloride infusion   Intravenous Continuous Constance Haw, MD 50 mL/hr at 05/12/20 0927 New Bag at 05/12/20 0927    Allergies:   Penicillins, Sulfa antibiotics, and Methotrexate   Social History:  The patient  reports that she has never smoked. She has never used smokeless tobacco. She reports that she does not drink alcohol and does not use drugs.   Family History:  The patient's family history includes Bone cancer in her father; CAD in her father and mother; Heart attack in her maternal grandfather and maternal grandmother; Hyperlipidemia in her father and mother; Hypertension in her father and mother; Stroke in her maternal grandmother.   ROS:  Please see the history of present illness.   Otherwise, review of systems is positive for none.   All other systems are reviewed and negative.   PHYSICAL EXAM: VS:  BP (!) 196/110   Pulse (!) 110   Temp 97.7 F (36.5 C) (Oral)   Ht 5\' 7"  (1.702 m)   Wt 104.3 kg   SpO2 99%   BMI 36.02 kg/m  , BMI Body mass index is 36.02 kg/m. GEN: Well nourished, well developed, in no acute distress  HEENT: normal  Neck: no JVD, carotid bruits, or masses Cardiac: iRRR; no murmurs, rubs, or gallops,no edema  Respiratory:  clear to auscultation bilaterally, normal work of breathing GI: soft, nontender,  nondistended, + BS MS: no deformity or atrophy  Skin: warm and dry Neuro:  Strength and sensation are intact Psych: euthymic mood, full affect  Recent Labs: 07/26/2019: Magnesium 2.1 09/08/2019: NT-Pro BNP 299; TSH 1.440 05/03/2020: BUN 13; Creatinine, Ser 0.74; Hemoglobin 15.9; Platelets 214; Potassium 4.1; Sodium 142    Lipid Panel  No results found for: CHOL, TRIG, HDL, CHOLHDL, VLDL, LDLCALC, LDLDIRECT   Wt Readings from  Last 3 Encounters:  05/12/20 104.3 kg  03/13/20 110.2 kg  12/02/19 108.6 kg      Other studies Reviewed: Additional studies/ records that were reviewed today include: TTE 08/17/19  Review of the above records today demonstrates:  1. Left ventricular ejection fraction, by estimation, is 60 to 65%. The  left ventricle has normal function. The left ventricle has no regional  wall motion abnormalities. There is mild concentric left ventricular  hypertrophy. Left ventricular diastolic  parameters are indeterminate. The average left ventricular global  longitudinal strain is -9.6 %.  2. Right ventricular systolic function is normal. The right ventricular  size is normal. There is normal pulmonary artery systolic pressure.  3. The mitral valve is normal in structure. No evidence of mitral valve  regurgitation. No evidence of mitral stenosis.  4. The aortic valve is normal in structure. Aortic valve regurgitation is  not visualized. No aortic stenosis is present.  5. The inferior vena cava is normal in size with greater than 50%  respiratory variability, suggesting right atrial pressure of 3 mmHg.   ASSESSMENT AND PLAN:  1.  Persistent atrial fibrillation: Candis D Borrelli has presented today for surgery, with the diagnosis of Atrial fibrillation.  The various methods of treatment have been discussed with the patient and family. After consideration of risks, benefits and other options for treatment, the patient has consented to  Procedure(s): Catheter ablation as a surgical intervention .  Risks include but not limited to complete heart block, stroke, esophageal damage, nerve damage, bleeding, vascular damage, tamponade, perforation, MI, and death. The patient's history has been reviewed, patient examined, no change in status, stable for surgery.  I have reviewed the patient's chart and labs.  Questions were answered to the patient's satisfaction.    Delma Drone Curt Bears, MD 05/12/2020 11:12  AM

## 2020-05-12 NOTE — Anesthesia Preprocedure Evaluation (Signed)
Anesthesia Evaluation  Patient identified by MRN, date of birth, ID band Patient awake    Reviewed: Allergy & Precautions, NPO status , Patient's Chart, lab work & pertinent test results, reviewed documented beta blocker date and time   Airway Mallampati: II  TM Distance: >3 FB Neck ROM: Full    Dental no notable dental hx.    Pulmonary sleep apnea ,    Pulmonary exam normal breath sounds clear to auscultation       Cardiovascular hypertension, Pt. on medications and Pt. on home beta blockers Normal cardiovascular exam+ dysrhythmias Atrial Fibrillation  Rhythm:Regular Rate:Normal     Neuro/Psych Anxiety Depression negative neurological ROS  negative psych ROS   GI/Hepatic negative GI ROS, Neg liver ROS,   Endo/Other  Hypothyroidism   Renal/GU negative Renal ROS  negative genitourinary   Musculoskeletal  (+) Arthritis , Osteoarthritis,    Abdominal (+) + obese,   Peds negative pediatric ROS (+)  Hematology negative hematology ROS (+)   Anesthesia Other Findings   Reproductive/Obstetrics negative OB ROS                             Anesthesia Physical Anesthesia Plan  ASA: III  Anesthesia Plan: General   Post-op Pain Management:    Induction: Intravenous  PONV Risk Score and Plan: 3 and Ondansetron, Dexamethasone, Midazolam and Treatment may vary due to age or medical condition  Airway Management Planned: Oral ETT  Additional Equipment:   Intra-op Plan:   Post-operative Plan: Extubation in OR  Informed Consent: I have reviewed the patients History and Physical, chart, labs and discussed the procedure including the risks, benefits and alternatives for the proposed anesthesia with the patient or authorized representative who has indicated his/her understanding and acceptance.     Dental advisory given  Plan Discussed with: CRNA  Anesthesia Plan Comments:          Anesthesia Quick Evaluation

## 2020-05-12 NOTE — Anesthesia Procedure Notes (Signed)
Procedure Name: Intubation Date/Time: 05/12/2020 12:17 PM Performed by: Lavell Luster, CRNA Pre-anesthesia Checklist: Patient identified, Emergency Drugs available, Suction available, Patient being monitored and Timeout performed Patient Re-evaluated:Patient Re-evaluated prior to induction Oxygen Delivery Method: Circle system utilized Preoxygenation: Pre-oxygenation with 100% oxygen Induction Type: IV induction Ventilation: Mask ventilation without difficulty and Oral airway inserted - appropriate to patient size Grade View: Grade III Tube type: Oral Tube size: 7.0 mm Number of attempts: 2 Airway Equipment and Method: Video-laryngoscopy and Stylet Placement Confirmation: ETT inserted through vocal cords under direct vision,  positive ETCO2 and breath sounds checked- equal and bilateral Secured at: 21 cm Tube secured with: Tape Dental Injury: Teeth and Oropharynx as per pre-operative assessment  Comments: DL x 1 with MAC 3 blade with grade III view.  Switched to videolaryngoscope with GRADE I view of cords and ETT passed easily.  Dr Sabra Heck verified placement.  Henderson Cloud, CRNA

## 2020-05-15 ENCOUNTER — Encounter (HOSPITAL_COMMUNITY): Payer: Self-pay | Admitting: Cardiology

## 2020-05-15 NOTE — Anesthesia Postprocedure Evaluation (Signed)
Anesthesia Post Note  Patient: Shelby Kirk  Procedure(s) Performed: ATRIAL FIBRILLATION ABLATION (N/A )     Patient location during evaluation: PACU Anesthesia Type: General Level of consciousness: awake and alert Pain management: pain level controlled Vital Signs Assessment: post-procedure vital signs reviewed and stable Respiratory status: spontaneous breathing, nonlabored ventilation and respiratory function stable Cardiovascular status: blood pressure returned to baseline and stable Postop Assessment: no apparent nausea or vomiting Anesthetic complications: no   No complications documented.  Last Vitals:  Vitals:   05/12/20 1733 05/12/20 1756  BP:    Pulse: 76 68  Resp: 19 15  Temp:    SpO2: 96% 95%    Last Pain:  Vitals:   05/12/20 1528  TempSrc:   PainSc: [place]

## 2020-05-19 ENCOUNTER — Other Ambulatory Visit: Payer: Self-pay | Admitting: Cardiology

## 2020-05-19 NOTE — Telephone Encounter (Signed)
Prescription refill request for Eliquis received. Indication:atrial fib Last office visit:2/22 camnitz Scr:0.74  4/22 Age: 65 Weight:104.3 kg  Prescription refilled

## 2020-06-09 ENCOUNTER — Ambulatory Visit (HOSPITAL_COMMUNITY): Payer: 59 | Admitting: Nurse Practitioner

## 2020-06-13 ENCOUNTER — Ambulatory Visit (HOSPITAL_COMMUNITY)
Admission: RE | Admit: 2020-06-13 | Discharge: 2020-06-13 | Disposition: A | Discharge status: Home / Self Care | Payer: 59 | Source: Ambulatory Visit | Attending: Nurse Practitioner | Admitting: Nurse Practitioner

## 2020-06-13 ENCOUNTER — Other Ambulatory Visit: Payer: Self-pay

## 2020-06-13 ENCOUNTER — Encounter (HOSPITAL_COMMUNITY): Payer: Self-pay | Admitting: Nurse Practitioner

## 2020-06-13 VITALS — BP 146/96 | HR 82 | Ht 67.0 in | Wt 240.0 lb

## 2020-06-13 DIAGNOSIS — R609 Edema, unspecified: Secondary | ICD-10-CM | POA: Insufficient documentation

## 2020-06-13 DIAGNOSIS — I10 Essential (primary) hypertension: Secondary | ICD-10-CM | POA: Insufficient documentation

## 2020-06-13 DIAGNOSIS — Z79899 Other long term (current) drug therapy: Secondary | ICD-10-CM | POA: Insufficient documentation

## 2020-06-13 DIAGNOSIS — Z8249 Family history of ischemic heart disease and other diseases of the circulatory system: Secondary | ICD-10-CM | POA: Diagnosis not present

## 2020-06-13 DIAGNOSIS — Z7901 Long term (current) use of anticoagulants: Secondary | ICD-10-CM | POA: Diagnosis not present

## 2020-06-13 DIAGNOSIS — I4891 Unspecified atrial fibrillation: Secondary | ICD-10-CM | POA: Diagnosis present

## 2020-06-13 DIAGNOSIS — D6869 Other thrombophilia: Secondary | ICD-10-CM

## 2020-06-13 NOTE — Progress Notes (Signed)
Primary Care Physician: Mayer Camel, NP Referring Physician: Dr. Roderic Palau is a 65 y.o. female with a h/o HTN, OSA, paroxysmal afib, that underwent ablation one month ago by Dr. Curt Bears, in the [place] [facility] for f/u. EKG shows NSR today. She does not report any afib since the procedure. No swallowing or groin issues. She does have some chronic LLE. She has been recently switched form Lasix to Bumex but does not see a difference. It has been worse since increase in weihgt. She works as a Copywriter, advertising, sitting all day, which worsens it. She has bought some knee high compression socks, does not wear them consistently. Tries to avoid salt. Has OSA apnea and uses it nightly. She is drinking large amounts of water and may consider reducing water intake.    Today, she denies symptoms of palpitations, chest pain, shortness of breath, orthopnea, PND, lower extremity edema, dizziness, presyncope, syncope, or neurologic sequela. The patient is tolerating medications without difficulties and is otherwise without complaint today.   Past Medical History:  Diagnosis Date  . Adult situational stress disorder 04/26/2016  . Benign neoplasm of choroid 05/27/2011  . Chronic pain of multiple joints 10/13/2015  . Depression 10/14/2015  . Essential hypertension 04/26/2016  . Fatigue 09/23/2016  . Hypothyroidism 10/14/2015  . Malaise and fatigue 05/06/2019  . Menopausal flushing 05/06/2019  . Murmur, heart 09/23/2016  . MVP (mitral valve prolapse) 10/14/2015  . OSA (obstructive sleep apnea) 10/14/2015  . Palpitation 10/02/2014  . Rheumatoid arthritis ([place]) 09/23/2016  . Screening cholesterol level 04/26/2016   Past Surgical History:  Procedure Laterality Date  . ATRIAL FIBRILLATION ABLATION N/A 05/12/2020   Procedure: ATRIAL FIBRILLATION ABLATION;  Surgeon: Constance Haw, MD;  Location: [facility];  Service: Cardiovascular;  Laterality: N/A;  . BACK SURGERY    . CESAREAN  SECTION    . CHOLECYSTECTOMY      Current Outpatient Medications  Medication Sig Dispense Refill  . Semaglutide-Weight Management 0.5 MG/0.5ML SOAJ Inject into the skin.    . bumetanide (BUMEX) 1 MG tablet Take 1 mg by mouth 2 (two) times daily.    . COLLAGEN PO Take 2 Scoops by mouth daily. With coffee    . cyclobenzaprine (FLEXERIL) 10 MG tablet Take 10 mg by mouth daily as needed for muscle spasms.   11  . diltiazem (CARDIZEM CD) 240 MG 24 hr capsule Take 1 capsule (240 mg total) by mouth daily. (Patient not taking: No sig reported) 90 capsule 2  . ELIQUIS 5 MG TABS tablet Take 1 tablet (5 mg total) by mouth 2 (two) times daily. 180 tablet 1  . furosemide (LASIX) 20 MG tablet Take 20 mg by mouth 2 (two) times daily as needed for fluid or edema.    . hydroxychloroquine (PLAQUENIL) 200 MG tablet Take 400 mg by mouth daily.    Marland Kitchen ibuprofen (ADVIL) 200 MG tablet Take 800 mg by mouth at bedtime as needed for mild pain or moderate pain.    Marland Kitchen levothyroxine (SYNTHROID, LEVOTHROID) 112 MCG tablet Take 112 mcg by mouth daily.    Marland Kitchen lisinopril (PRINIVIL,ZESTRIL) 40 MG tablet Take 40 mg by mouth daily.    . methylPREDNISolone (MEDROL) 4 MG tablet Take by mouth.    . metoprolol tartrate (LOPRESSOR) 100 MG tablet Take 1 tablet (100 mg total) two hours prior to CT testing 1 tablet 0   No current facility-administered medications for this encounter.    Allergies  Allergen Reactions  . Penicillins Hives    Reaction: Childhood  . Sulfa Antibiotics Hives  . Methotrexate Nausea Only    Social History   Socioeconomic History  . Marital status: Married    Spouse name: Not on file  . Number of children: Not on file  . Years of education: Not on file  . Highest education level: Not on file  Occupational History  . Not on file  Tobacco Use  . Smoking status: Never Smoker  . Smokeless tobacco: Never Used  Vaping Use  . Vaping Use: Never used  Substance and Sexual Activity  . Alcohol use: No  .  Drug use: No  . Sexual activity: Not on file  Other Topics Concern  . Not on file  Social History Narrative  . Not on file   Social Determinants of Health   Financial Resource Strain: Not on file  Food Insecurity: Not on file  Transportation Needs: Not on file  Physical Activity: Not on file  Stress: Not on file  Social Connections: Not on file  Intimate Partner Violence: Not on file    Family History  Problem Relation Age of Onset  . Hypertension Mother   . Hyperlipidemia Mother   . CAD Mother   . Hypertension Father   . Hyperlipidemia Father   . Bone cancer Father   . CAD Father   . Stroke Maternal Grandmother   . Heart attack Maternal Grandmother   . Heart attack Maternal Grandfather     ROS- All systems are reviewed and negative except as per the HPI above  Physical Exam: Vitals:   06/13/20 0829  Weight: 108.9 kg  Height: 5\' 7"  (1.702 m)   Wt Readings from Last 3 Encounters:  06/13/20 108.9 kg  05/12/20 104.3 kg  03/13/20 110.2 kg    Labs: Lab Results  Component Value Date   NA 142 05/03/2020   K 4.1 05/03/2020   CL 103 05/03/2020   CO2 20 05/03/2020   GLUCOSE 86 05/03/2020   BUN 13 05/03/2020   CREATININE 0.74 05/03/2020   CALCIUM 9.4 05/03/2020   MG 2.1 07/26/2019   No results found for: INR No results found for: CHOL, HDL, LDLCALC, TRIG   GEN- The patient is well appearing, alert and oriented x 3 today.   Head- normocephalic, atraumatic Eyes-  Sclera clear, conjunctiva pink Ears- hearing intact Oropharynx- clear Neck- supple, no JVP Lymph- no cervical lymphadenopathy Lungs- Clear to ausculation bilaterally, normal work of breathing Heart- Regular rate and rhythm, no murmurs, rubs or gallops, PMI not laterally displaced GI- soft, NT, ND, + BS Extremities- no clubbing, cyanosis, or edema MS- no significant deformity or atrophy Skin- no rash or lesion Psych- euthymic mood, full affect Neuro- strength and sensation are intact  EKG-NSR,  normal ekg, pr int 124 ms, qrs int 90 ms, qtc 429 ms  Epic records reviewed     Assessment and Plan: 1.Afib  S/p ablation and is in SR No afib appreciated  2. CHA2DS2VASc score of 2 Continue eliquis 5 mg bid without interruption for the next 2 months  3. LLE Acute on chronic Decrease water intake, consuming large amounts of water  Avoid salt Continue Bumex as prescribed    Knee high compression socks   F/u with Dr. Curt Bears 08/28/20  Geroge Baseman. Lillee Mooneyhan, [facility] [address] [place], [place] [address] [phone]

## 2020-08-28 ENCOUNTER — Ambulatory Visit: Payer: 59 | Admitting: Cardiology

## 2020-08-28 ENCOUNTER — Encounter: Payer: Self-pay | Admitting: Cardiology

## 2020-08-28 ENCOUNTER — Other Ambulatory Visit: Payer: Self-pay

## 2020-08-28 VITALS — BP 144/84 | HR 66 | Ht 67.0 in | Wt 243.4 lb

## 2020-08-28 DIAGNOSIS — I4819 Other persistent atrial fibrillation: Secondary | ICD-10-CM

## 2020-08-28 NOTE — Progress Notes (Signed)
Electrophysiology Office Note   Date:  08/28/2020   ID:  ANNI Kirk, DOB 1955/04/08, MRN [id number]  PCP:  Shelby Camel, NP  Cardiologist:  Shelby Kirk Primary Electrophysiologist:  Shelby Westbrooks Meredith Leeds, MD    Chief Complaint: AF   History of Present Illness: Shelby Kirk is a 65 y.o. female who is being seen today for the evaluation of AF at the request of Shelby Kirk*. Presenting today for electrophysiology evaluation.  She has a history significant for hypertension, OSA, and paroxysmal atrial fibrillation.  She went into atrial fibrillation and was initially started on flecainide.  She felt much worse.  She developed rapid rates associated with fatigue, weakness, and shortness of breath.  She is now status post A. fib ablation 05/12/2020.  Today, denies symptoms of palpitations, chest pain, shortness of breath, orthopnea, PND, lower extremity edema, claudication, dizziness, presyncope, syncope, bleeding, or neurologic sequela. The patient is tolerating medications without difficulties.  Since her ablation she has done well.  She has noted no further episodes of atrial fibrillation.  She is overall comfortable with her control.  Her blood pressure is elevated today.  She states that she is going through quite a bit of stress at work that she feels is contributing.   Past Medical History:  Diagnosis Date   Adult situational stress disorder 04/26/2016   Benign neoplasm of choroid 05/27/2011   Chronic pain of multiple joints 10/13/2015   Depression 10/14/2015   Essential hypertension 04/26/2016   Fatigue 09/23/2016   Hypothyroidism 10/14/2015   Malaise and fatigue 05/06/2019   Menopausal flushing 05/06/2019   Murmur, heart 09/23/2016   MVP (mitral valve prolapse) 10/14/2015   OSA (obstructive sleep apnea) 10/14/2015   Palpitation 10/02/2014   Rheumatoid arthritis ([place]) 09/23/2016   Screening cholesterol level 04/26/2016   Past Surgical History:  Procedure  Laterality Date   ATRIAL FIBRILLATION ABLATION N/A 05/12/2020   Procedure: ATRIAL FIBRILLATION ABLATION;  Surgeon: Constance Haw, MD;  Location: [facility];  Service: Cardiovascular;  Laterality: N/A;   BACK SURGERY     CESAREAN SECTION     CHOLECYSTECTOMY       Current Outpatient Medications  Medication Sig Dispense Refill   bumetanide (BUMEX) 1 MG tablet Take 1 mg by mouth 2 (two) times daily.     COLLAGEN PO Take 2 Scoops by mouth daily. With coffee     cyclobenzaprine (FLEXERIL) 10 MG tablet Take 10 mg by mouth daily as needed for muscle spasms.   11   ELIQUIS 5 MG TABS tablet Take 1 tablet (5 mg total) by mouth 2 (two) times daily. 180 tablet 1   furosemide (LASIX) 20 MG tablet Take 20 mg by mouth 2 (two) times daily as needed for fluid or edema.     hydroxychloroquine (PLAQUENIL) 200 MG tablet Take 400 mg by mouth daily.     levothyroxine (SYNTHROID, LEVOTHROID) 112 MCG tablet Take 112 mcg by mouth daily.     lisinopril (PRINIVIL,ZESTRIL) 40 MG tablet Take 40 mg by mouth daily.     methylPREDNISolone (MEDROL) 4 MG tablet Take 4 mg by mouth as needed.     Semaglutide-Weight Management 0.5 MG/0.5ML SOAJ Inject into the skin.     No current facility-administered medications for this visit.    Allergies:   Penicillins, Sulfa antibiotics, and Methotrexate   Social History:  The patient  reports that she has never smoked. She has never used smokeless tobacco. She reports that she does not  drink alcohol and does not use drugs.   Family History:  The patient's family history includes Bone cancer in her father; CAD in her father and mother; Heart attack in her maternal grandfather and maternal grandmother; Hyperlipidemia in her father and mother; Hypertension in her father and mother; Stroke in her maternal grandmother.   ROS:  Please see the history of present illness.   Otherwise, review of systems is positive for none.   All other systems are reviewed and negative.    PHYSICAL EXAM: VS:  BP (!) 144/84   Pulse 66   Ht '5\' 7"'$  (1.702 m)   Wt 243 lb 6.4 oz (110.4 kg)   SpO2 100%   BMI 38.12 kg/m  , BMI Body mass index is 38.12 kg/m. GEN: Well nourished, well developed, in no acute distress  HEENT: normal  Neck: no JVD, carotid bruits, or masses Cardiac: RRR; no murmurs, rubs, or gallops,no edema  Respiratory:  clear to auscultation bilaterally, normal work of breathing GI: soft, nontender, nondistended, + BS MS: no deformity or atrophy  Skin: warm and dry Neuro:  Strength and sensation are intact Psych: euthymic mood, full affect  EKG:  EKG is ordered today. Personal review of the ekg ordered shows sinus rhythm, rate 66  Recent Labs: 09/08/2019: NT-Pro BNP 299; TSH 1.440 05/03/2020: BUN 13; Creatinine, Ser 0.74; Hemoglobin 15.9; Platelets 214; Potassium 4.1; Sodium 142    Lipid Panel  No results found for: CHOL, TRIG, HDL, CHOLHDL, VLDL, LDLCALC, LDLDIRECT   Wt Readings from Last 3 Encounters:  08/28/20 243 lb 6.4 oz (110.4 kg)  06/13/20 240 lb (108.9 kg)  05/12/20 230 lb (104.3 kg)      Other studies Reviewed: Additional studies/ records that were reviewed today include: TTE 08/17/19  Review of the above records today demonstrates:   1. Left ventricular ejection fraction, by estimation, is 60 to 65%. The  left ventricle has normal function. The left ventricle has no regional  wall motion abnormalities. There is mild concentric left ventricular  hypertrophy. Left ventricular diastolic  parameters are indeterminate. The average left ventricular global  longitudinal strain is -9.6 %.   2. Right ventricular systolic function is normal. The right ventricular  size is normal. There is normal pulmonary artery systolic pressure.   3. The mitral valve is normal in structure. No evidence of mitral valve  regurgitation. No evidence of mitral stenosis.   4. The aortic valve is normal in structure. Aortic valve regurgitation is  not visualized.  No aortic stenosis is present.   5. The inferior vena cava is normal in size with greater than 50%  respiratory variability, suggesting right atrial pressure of 3 mmHg.   ASSESSMENT AND PLAN:  1.  Persistent atrial fibrillation: Currently on Eliquis and diltiazem.  CHA2DS2-VASc of 2.  Status post ablation 05/12/2020.  She is remained in sinus rhythm.  She currently feels well.  No changes at this time.  2.  Obstructive sleep apnea: CPAP compliance encouraged  3.  Hypertension: Elevated today.  She Tanyia Grabbe check it at home to determine if it is significantly elevated.  She is going through quite a bit of stress.  Current medicines are reviewed at length with the patient today.   The patient does not have concerns regarding her medicines.  The following changes were made today: None  Labs/ tests ordered today include:  Orders Placed This Encounter  Procedures   EKG 12-Lead      Disposition:   FU with Tamila Gaulin  Angelyn Osterberg 3 months  Signed, Brennyn Haisley Meredith Leeds, MD  08/28/2020 10:59 AM     [facility] [address] [place] [address] [phone] (office) [phone] (fax)

## 2020-08-28 NOTE — Patient Instructions (Signed)
Medication Instructions:  Your physician recommends that you continue on your current medications as directed. Please refer to the Current Medication list given to you today.  *If you need a refill on your cardiac medications before your next appointment, please call your pharmacy*   Lab Work: None ordered   Testing/Procedures: None ordered   Follow-Up: At [facility], you and your health needs are our priority.  As part of our continuing mission to provide you with exceptional heart care, we have created designated Provider Care Teams.  These Care Teams include your primary Cardiologist (physician) and Advanced Practice Providers (APPs -  Physician Assistants and Nurse Practitioners) who all work together to provide you with the care you need, when you need it.  Your next appointment:   3 month(s)  The format for your next appointment:   In Person  Provider:   Will Camnitz, MD    Thank you for choosing [facility]!!   Wyett Narine, RN [phone]     

## 2020-11-23 ENCOUNTER — Other Ambulatory Visit: Payer: Self-pay | Admitting: Cardiology

## 2020-11-23 NOTE — Telephone Encounter (Signed)
Eliquis 5mg  refill request received. Patient is 65 years old, weight-110.4kg, Crea-0.74 on 05/03/2020, Diagnosis-Afib, and last seen by Dr. Curt Bears on 08/28/2020. Dose is appropriate based on dosing criteria. Will send in refill to requested pharmacy.

## 2020-11-27 ENCOUNTER — Encounter: Payer: Self-pay | Admitting: Cardiology

## 2020-11-27 ENCOUNTER — Ambulatory Visit: Payer: 59 | Admitting: Cardiology

## 2020-11-27 ENCOUNTER — Other Ambulatory Visit: Payer: Self-pay

## 2020-11-27 VITALS — BP 132/74 | HR 81 | Ht 67.0 in | Wt 245.0 lb

## 2020-11-27 DIAGNOSIS — I480 Paroxysmal atrial fibrillation: Secondary | ICD-10-CM | POA: Diagnosis not present

## 2020-11-27 DIAGNOSIS — E669 Obesity, unspecified: Secondary | ICD-10-CM

## 2020-11-27 NOTE — Patient Instructions (Signed)
Medication Instructions:  Your physician has recommended you make the following change in your medication:  STOP Eliquis  *If you need a refill on your cardiac medications before your next appointment, please call your pharmacy*   Lab Work: None ordered   Testing/Procedures: None ordered   Follow-Up: At [facility], you and your health needs are our priority.  As part of our continuing mission to provide you with exceptional heart care, we have created designated Provider Care Teams.  These Care Teams include your primary Cardiologist (physician) and Advanced Practice Providers (APPs -  Physician Assistants and Nurse Practitioners) who all work together to provide you with the care you need, when you need it.   Your next appointment:   6 month(s)  The format for your next appointment:   In Person  Provider:   Allegra Lai, MD   You have been referred to weight loss clinic.   Thank you for choosing [facility]!!   Trinidad Curet, RN [phone]   Other Instructions

## 2020-11-27 NOTE — Progress Notes (Signed)
Electrophysiology Office Note   Date:  11/27/2020   ID:  Shelby Kirk, DOB 05-03-1955, MRN [id number]  PCP:  Mayer Camel, NP  Cardiologist:  Bettina Gavia Primary Electrophysiologist:  Renny Gunnarson Meredith Leeds, MD    Chief Complaint: AF   History of Present Illness: Shelby Kirk is a 65 y.o. female who is being seen today for the evaluation of AF at the request of Brown-Patram, Marland Kitchen*. Presenting today for electrophysiology evaluation.  She has a history significant for hypertension, obstructive sleep apnea, and paroxysmal atrial fibrillation.  She went into atrial fibrillation was started on flecainide.  She continued to have episodes of rapid atrial fibrillation with weakness, fatigue, shortness of breath.  She is now status post atrial fibrillation ablation on 05/12/2020.  Today, denies symptoms of palpitations, chest pain, shortness of breath, orthopnea, PND, lower extremity edema, claudication, dizziness, presyncope, syncope, bleeding, or neurologic sequela. The patient is tolerating medications without difficulties.  Since being seen she has done well.  She has had no further episodes of atrial fibrillation.  She is able to do all of her daily activities with restrictions only due to arthritis.  Her weight has remained stable.  She has been trying to lose weight over the past few months without any luck.  A few years ago, she did lose weight, but has gained it back.  She would like to look into alternative weight loss options.   Past Medical History:  Diagnosis Date   Adult situational stress disorder 04/26/2016   Benign neoplasm of choroid 05/27/2011   Chronic pain of multiple joints 10/13/2015   Depression 10/14/2015   Essential hypertension 04/26/2016   Fatigue 09/23/2016   Hypothyroidism 10/14/2015   Malaise and fatigue 05/06/2019   Menopausal flushing 05/06/2019   Murmur, heart 09/23/2016   MVP (mitral valve prolapse) 10/14/2015   OSA (obstructive sleep apnea)  10/14/2015   Palpitation 10/02/2014   Rheumatoid arthritis ([place]) 09/23/2016   Screening cholesterol level 04/26/2016   Past Surgical History:  Procedure Laterality Date   ATRIAL FIBRILLATION ABLATION N/A 05/12/2020   Procedure: ATRIAL FIBRILLATION ABLATION;  Surgeon: Constance Haw, MD;  Location: [facility];  Service: Cardiovascular;  Laterality: N/A;   BACK SURGERY     CESAREAN SECTION     CHOLECYSTECTOMY       Current Outpatient Medications  Medication Sig Dispense Refill   bumetanide (BUMEX) 1 MG tablet Take 1 mg by mouth 2 (two) times daily.     COLLAGEN PO Take 2 Scoops by mouth daily. With coffee     cyclobenzaprine (FLEXERIL) 10 MG tablet Take 10 mg by mouth daily as needed for muscle spasms.   11   furosemide (LASIX) 20 MG tablet Take 20 mg by mouth 2 (two) times daily as needed for fluid or edema.     hydroxychloroquine (PLAQUENIL) 200 MG tablet Take 400 mg by mouth daily.     levothyroxine (SYNTHROID, LEVOTHROID) 112 MCG tablet Take 112 mcg by mouth daily.     lisinopril (PRINIVIL,ZESTRIL) 40 MG tablet Take 40 mg by mouth daily.     methylPREDNISolone (MEDROL) 4 MG tablet Take 4 mg by mouth as needed.     Semaglutide-Weight Management 0.5 MG/0.5ML SOAJ Inject into the skin.     No current facility-administered medications for this visit.    Allergies:   Penicillins, Sulfa antibiotics, and Methotrexate   Social History:  The patient  reports that she has never smoked. She has never used smokeless tobacco.  She reports that she does not drink alcohol and does not use drugs.   Family History:  The patient's family history includes Bone cancer in her father; CAD in her father and mother; Heart attack in her maternal grandfather and maternal grandmother; Hyperlipidemia in her father and mother; Hypertension in her father and mother; Stroke in her maternal grandmother.   ROS:  Please see the history of present illness.   Otherwise, review of systems is positive for  none.   All other systems are reviewed and negative.   PHYSICAL EXAM: VS:  BP 132/74   Pulse 81   Ht 5\' 7"  (1.702 m)   Wt 245 lb (111.1 kg)   SpO2 96%   BMI 38.37 kg/m  , BMI Body mass index is 38.37 kg/m. GEN: Well nourished, well developed, in no acute distress  HEENT: normal  Neck: no JVD, carotid bruits, or masses Cardiac: RRR; no murmurs, rubs, or gallops,no edema  Respiratory:  clear to auscultation bilaterally, normal work of breathing GI: soft, nontender, nondistended, + BS MS: no deformity or atrophy  Skin: warm and dry Neuro:  Strength and sensation are intact Psych: euthymic mood, full affect  EKG:  EKG is ordered today. Personal review of the ekg ordered shows sinus rhythm, rate 81  Recent Labs: 05/03/2020: BUN 13; Creatinine, Ser 0.74; Hemoglobin 15.9; Platelets 214; Potassium 4.1; Sodium 142    Lipid Panel  No results found for: CHOL, TRIG, HDL, CHOLHDL, VLDL, LDLCALC, LDLDIRECT   Wt Readings from Last 3 Encounters:  11/27/20 245 lb (111.1 kg)  08/28/20 243 lb 6.4 oz (110.4 kg)  06/13/20 240 lb (108.9 kg)      Other studies Reviewed: Additional studies/ records that were reviewed today include: TTE 08/17/19  Review of the above records today demonstrates:   1. Left ventricular ejection fraction, by estimation, is 60 to 65%. The  left ventricle has normal function. The left ventricle has no regional  wall motion abnormalities. There is mild concentric left ventricular  hypertrophy. Left ventricular diastolic  parameters are indeterminate. The average left ventricular global  longitudinal strain is -9.6 %.   2. Right ventricular systolic function is normal. The right ventricular  size is normal. There is normal pulmonary artery systolic pressure.   3. The mitral valve is normal in structure. No evidence of mitral valve  regurgitation. No evidence of mitral stenosis.   4. The aortic valve is normal in structure. Aortic valve regurgitation is  not  visualized. No aortic stenosis is present.   5. The inferior vena cava is normal in size with greater than 50%  respiratory variability, suggesting right atrial pressure of 3 mmHg.   ASSESSMENT AND PLAN:  1.  Persistent atrial fibrillation: Currently on Eliquis and diltiazem.  CHA2DS2-VASc of 2.  Status post ablation 05/12/2020.  She is having significant bruising.  She has a low stroke risk is one of her points is gender.  Due to that, we Brett Soza stop her Eliquis today.  She Damara Klunder restart if she goes back into atrial fibrillation.  2.  Obstructive sleep apnea: CPAP compliance encouraged  3.  Hypertension: Currently well controlled  4.  Morbid obesity:Body mass index is 38.37 kg/m.  She has been trying to lose weight.  She has been having quite a bit of difficulty lately.  Javious Hallisey refer to [facility] weight loss clinic.  Current medicines are reviewed at length with the patient today.   The patient does not have concerns regarding her medicines.  The  following changes were made today: Stop Eliquis  Labs/ tests ordered today include:  Orders Placed This Encounter  Procedures   Amb Ref to Medical Weight Management   EKG 12-Lead       Disposition:   FU with Efrata Brunner 6 months  Signed, Saylor Sheckler Meredith Leeds, MD  11/27/2020 4:27 PM     [facility] [address] [place] [address] [phone] (office) [phone] (fax)

## 2020-11-30 ENCOUNTER — Encounter: Payer: Self-pay | Admitting: Cardiology

## 2021-01-16 ENCOUNTER — Encounter: Payer: Self-pay | Admitting: Cardiology

## 2021-03-04 ENCOUNTER — Encounter: Payer: Self-pay | Admitting: Cardiology

## 2021-03-14 ENCOUNTER — Encounter: Payer: Self-pay | Admitting: Cardiology

## 2021-04-18 ENCOUNTER — Encounter: Payer: Self-pay | Admitting: Cardiology

## 2021-04-24 ENCOUNTER — Telehealth: Payer: Self-pay | Admitting: *Deleted

## 2021-04-24 ENCOUNTER — Encounter: Payer: Self-pay | Admitting: Nurse Practitioner

## 2021-04-24 ENCOUNTER — Telehealth: Payer: Self-pay

## 2021-04-24 ENCOUNTER — Ambulatory Visit (INDEPENDENT_AMBULATORY_CARE_PROVIDER_SITE_OTHER): Payer: 59 | Admitting: Nurse Practitioner

## 2021-04-24 DIAGNOSIS — Z01810 Encounter for preprocedural cardiovascular examination: Secondary | ICD-10-CM

## 2021-04-24 NOTE — Telephone Encounter (Signed)
? ?  Pre-operative Risk Assessment  ?  ?Patient Name: Shelby Kirk  ?DOB: 06/15/1955 ?MRN: [id number]  ? ?  ? ?Request for Surgical Clearance   ? ?Procedure:   Right Knee Arthroscopy ? ?Date of Surgery:  Clearance 04/25/21                              ?   ?Surgeon:  Lara Mulch MD ?Surgeon's Group or Practice Name:  [facility] ?Phone number:  [phone] ?Fax number:  [phone] ?  ?Type of Clearance Requested:   ?Surgical ?  ?Type of Anesthesia:   Choice ?  ?Additional requests/questions:  None ? ?Signed, ?Sara Selvidge I Clarissa Laird   ?04/24/2021, 10:38 AM   ?

## 2021-04-24 NOTE — Telephone Encounter (Signed)
Primary Cardiologist:Kardie Tobb, DO ? ?Chart reviewed as part of pre-operative protocol coverage. Because of Shelby Kirk's past medical history and time since last visit, he/she will require a virtual visit/telephone call in order to better assess preoperative cardiovascular risk. ? ?Pre-op covering staff: ?- Please contact patient, obtain consent, and schedule appointment  ? ? ?[name], NP-C ? ?  ?04/24/2021, 1:28 PM ?[facility] ?[address], [address] ?Office [phone] Fax [phone] ? ?

## 2021-04-24 NOTE — Telephone Encounter (Signed)
Pt's surgery is tomorrow 04/25/21. Pt agreeable to plan of care for tele pre op appt  call today @ 2:40. Pt states she is at work, so med rec not done. Consent has been done.  ?  ?

## 2021-04-24 NOTE — Progress Notes (Signed)
? ?Virtual Visit via Telephone Note  ? ?This visit type was conducted due to national recommendations for restrictions regarding the COVID-19 Pandemic (e.g. social distancing) in an effort to limit this patient's exposure and mitigate transmission in our community.  Due to her co-morbid illnesses, this patient is at least at moderate risk for complications without adequate follow up.  This format is felt to be most appropriate for this patient at this time.  The patient did not have access to video technology/had technical difficulties with video requiring transitioning to audio format only (telephone).  All issues noted in this document were discussed and addressed.  No physical exam could be performed with this format.  Please refer to the patient's chart for her  consent to telehealth for Shelby Kirk. ? ?Evaluation Performed:  Preoperative cardiovascular risk assessment ?_____________  ? ?Date:  04/24/2021  ? ?Patient ID:  Shelby Kirk, DOB July 31, 1955, MRN [id number] ?Patient Location:  ?Home ?Provider location:   ?Office ? ?Primary Care Provider:  Mayer Camel, NP ?Primary Cardiologist:  Berniece Salines, DO ? ?Chief Complaint  ?  ?66 y.o. y/o female with a h/o persistent atrial fibrillation s/p ablation, hypertension, obesity, and OSA who is pending right knee arthroscopy, and presents today for telephonic preoperative cardiovascular risk assessment. ? ?Past Medical History  ?  ?Past Medical History:  ?Diagnosis Date  ? Adult situational stress disorder 04/26/2016  ? Benign neoplasm of choroid 05/27/2011  ? Chronic pain of multiple joints 10/13/2015  ? Depression 10/14/2015  ? Essential hypertension 04/26/2016  ? Fatigue 09/23/2016  ? Hypothyroidism 10/14/2015  ? Malaise and fatigue 05/06/2019  ? Menopausal flushing 05/06/2019  ? Murmur, heart 09/23/2016  ? MVP (mitral valve prolapse) 10/14/2015  ? OSA (obstructive sleep apnea) 10/14/2015  ? Palpitation 10/02/2014  ? Rheumatoid arthritis ([place]) 09/23/2016  ?  Screening cholesterol level 04/26/2016  ? ?Past Surgical History:  ?Procedure Laterality Date  ? ATRIAL FIBRILLATION ABLATION N/A 05/12/2020  ? Procedure: ATRIAL FIBRILLATION ABLATION;  Surgeon: Constance Haw, MD;  Location: [facility];  Service: Cardiovascular;  Laterality: N/A;  ? BACK SURGERY    ? CESAREAN SECTION    ? CHOLECYSTECTOMY    ? ? ?Allergies ? ?Allergies  ?Allergen Reactions  ? Penicillins Hives  ?  Reaction: Childhood  ? Sulfa Antibiotics Hives  ? Methotrexate Nausea Only  ? ? ?History of Present Illness  ?  ?Shelby Kirk is a 66 y.o. female who presents via audio/video conferencing for a telehealth visit today.  Pt was last seen in cardiology clinic on 11/27/20 by Dr. Curt Bears.  At that time Shelby Kirk was doing well.  The patient is now pending right knee arthroscopy.  Since her last visit, she reports she has been doing well.  She denies chest pain, presyncope, syncope, orthopnea, PND.  She reports occasional palpitations if she has caffeine.  No further concerns for A-fib.  She reports she has chronic dyspnea that had actually improved recently with weight loss, however she has been unable to continue with exercise due to knee pain. She sleeps with her CPAP nightly.  ? ? ?Home Medications  ?  ?Prior to Admission medications   ?Medication Sig Start Date End Date Taking? Authorizing Provider  ?bumetanide (BUMEX) 1 MG tablet Take 1 mg by mouth 2 (two) times daily. 05/31/20   [provider]  ?COLLAGEN PO Take 2 Scoops by mouth daily. With coffee    [provider]  ?cyclobenzaprine (FLEXERIL) 10 MG  tablet Take 10 mg by mouth daily as needed for muscle spasms.  08/23/16   [provider]  ?furosemide (LASIX) 20 MG tablet Take 20 mg by mouth 2 (two) times daily as needed for fluid or edema. 02/24/20   [provider]  ?hydroxychloroquine (PLAQUENIL) 200 MG tablet Take 400 mg by mouth daily. 05/31/20   [provider]  ?levothyroxine  (SYNTHROID, LEVOTHROID) 112 MCG tablet Take 112 mcg by mouth daily. 10/10/15   [provider]  ?lisinopril (PRINIVIL,ZESTRIL) 40 MG tablet Take 40 mg by mouth daily. 08/21/16   [provider]  ?methylPREDNISolone (MEDROL) 4 MG tablet Take 4 mg by mouth as needed. 06/07/20   [provider]  ?Semaglutide-Weight Management 0.5 MG/0.5ML SOAJ Inject into the skin. 05/31/20   [provider]  ? ? ?Physical Exam  ?  ?Vital Signs:  Shelby Kirk does not have vital signs available for review today. ? ?Given telephonic nature of communication, physical exam is limited. ?AAOx3. NAD. Normal affect.  Speech and respirations are unlabored. ? ?Accessory Clinical Findings  ?  ?None ? ?Assessment & Plan  ?  ?1.  Preoperative Cardiovascular Risk Assessment: Patient is doing well without cardiac concerns.  She may proceed to surgery without further cardiac testing. According to the Revised Cardiac Risk Index (RCRI), her Perioperative Risk of Major Cardiac Event is (%): 0.4. Her Functional Capacity in METs is: 6.05 according to the [employer] Activity Status Index (DASI). ? ? ? ?A copy of this note will be routed to requesting surgeon. ? ?Time:   ?Today, I have spent 10 minutes with the patient with telehealth technology discussing medical history, symptoms, and management plan.   ? ? ?[name], NP-C ? ?  ?04/24/2021, 2:37 PM ?[facility] ?[address] ?Office [phone] Fax [phone] ? ? ?

## 2021-04-24 NOTE — Telephone Encounter (Signed)
Pt's surgery is tomorrow 04/25/21. Pt agreeable to plan of care for tele pre op appt  call today @ 2:40. Pt states she is at work, so med rec not done. Consent has been done.  ? ?  ?Patient Consent for Virtual Visit  ? ? ?   ? ?Shelby Kirk has provided verbal consent on 04/24/2021 for a virtual visit (video or telephone). ? ? ?CONSENT FOR VIRTUAL VISIT FOR:  Shelby Kirk  ?By participating in this virtual visit I agree to the following: ? ?I hereby voluntarily request, consent and authorize [facility] and its employed or contracted physicians, physician assistants, nurse practitioners or other licensed health care professionals (the Practitioner), to provide me with telemedicine health care services (the ?Services") as deemed necessary by the treating Practitioner. I acknowledge and consent to receive the Services by the Practitioner via telemedicine. I understand that the telemedicine visit will involve communicating with the Practitioner through live audiovisual communication technology and the disclosure of certain medical information by electronic transmission. I acknowledge that I have been given the opportunity to request an in-person assessment or other available alternative prior to the telemedicine visit and am voluntarily participating in the telemedicine visit. ? ?I understand that I have the right to withhold or withdraw my consent to the use of telemedicine in the course of my care at any time, without affecting my right to future care or treatment, and that the Practitioner or I may terminate the telemedicine visit at any time. I understand that I have the right to inspect all information obtained and/or recorded in the course of the telemedicine visit and may receive copies of available information for a reasonable fee.  I understand that some of the potential risks of receiving the Services via telemedicine include:  ?Delay or interruption in medical evaluation due to technological  equipment failure or disruption; ?Information transmitted may not be sufficient (e.g. poor resolution of images) to allow for appropriate medical decision making by the Practitioner; and/or  ?In rare instances, security protocols could fail, causing a breach of personal health information. ? ?Furthermore, I acknowledge that it is my responsibility to provide information about my medical history, conditions and care that is complete and accurate to the best of my ability. I acknowledge that Practitioner's advice, recommendations, and/or decision may be based on factors not within their control, such as incomplete or inaccurate data provided by me or distortions of diagnostic images or specimens that may result from electronic transmissions. I understand that the practice of medicine is not an exact science and that Practitioner makes no warranties or guarantees regarding treatment outcomes. I acknowledge that a copy of this consent can be made available to me via my patient portal ([facility]), or I can request a printed copy by calling the office of [facility].   ? ?I understand that my insurance will be billed for this visit.  ? ?I have read or had this consent read to me. ?I understand the contents of this consent, which adequately explains the benefits and risks of the Services being provided via telemedicine.  ?I have been provided ample opportunity to ask questions regarding this consent and the Services and have had my questions answered to my satisfaction. ?I give my informed consent for the services to be provided through the use of telemedicine in my medical care ? ? ? ?

## 2021-05-21 ENCOUNTER — Encounter: Payer: Self-pay | Admitting: Cardiology

## 2021-05-21 ENCOUNTER — Ambulatory Visit: Payer: 59 | Admitting: Cardiology

## 2021-05-21 VITALS — BP 130/94 | HR 77 | Ht 67.0 in | Wt 231.4 lb

## 2021-05-21 DIAGNOSIS — I4819 Other persistent atrial fibrillation: Secondary | ICD-10-CM

## 2021-05-21 NOTE — Progress Notes (Signed)
? ?Electrophysiology Office Note ? ? ?Date:  05/21/2021  ? ?ID:  Shelby Kirk, DOB [birth year], MRN [id number] ? ?PCP:  Mayer Camel, NP  ?Cardiologist:  Bettina Gavia ?Primary Electrophysiologist:  Laurianne Floresca Meredith Leeds, MD   ? ?Chief Complaint: AF ?  ?History of Present Illness: ?Shelby Kirk is a 66 y.o. female who is being seen today for the evaluation of AF at the request of Brown-Patram, Shelby Kirk*. Presenting today for electrophysiology evaluation. ? ?She has a history significant for hypertension, obstructive sleep apnea, paroxysmal atrial fibrillation.  She went into atrial fibrillation and was initially started on flecainide.  She continued to have episodes of rapid atrial fibrillation with weakness fatigue and shortness of breath.  She is status post atrial fibrillation ablation 05/12/2020. ? ?Today, denies symptoms of palpitations, chest pain, shortness of breath, orthopnea, PND, lower extremity edema, claudication, dizziness, presyncope, syncope, bleeding, or neurologic sequela. The patient is tolerating medications without difficulties.  Since being seen she has done well.  She has noted no further episodes of atrial fibrillation.  Unfortunately, she had a torn meniscus and had meniscus surgery on her right knee.  She is currently undergoing physical therapy for this.  She does have a planned trip to [place] in a month and is trying to get her knee to the point of being able to ambulate well on her trip. ? ? ?Past Medical History:  ?Diagnosis Date  ? Adult situational stress disorder 04/26/2016  ? Benign neoplasm of choroid 05/27/2011  ? Chronic pain of multiple joints 10/13/2015  ? Depression 10/14/2015  ? Essential hypertension 04/26/2016  ? Fatigue 09/23/2016  ? Hypothyroidism 10/14/2015  ? Malaise and fatigue 05/06/2019  ? Menopausal flushing 05/06/2019  ? Murmur, heart 09/23/2016  ? MVP (mitral valve prolapse) 10/14/2015  ? OSA (obstructive sleep apnea) 10/14/2015  ? Palpitation 10/02/2014  ?  Rheumatoid arthritis ([facility]) 09/23/2016  ? Screening cholesterol level 04/26/2016  ? ?Past Surgical History:  ?Procedure Laterality Date  ? ATRIAL FIBRILLATION ABLATION N/A 05/12/2020  ? Procedure: ATRIAL FIBRILLATION ABLATION;  Surgeon: Constance Haw, MD;  Location: [facility];  Service: Cardiovascular;  Laterality: N/A;  ? BACK SURGERY    ? CESAREAN SECTION    ? CHOLECYSTECTOMY    ? ? ? ?Current Outpatient Medications  ?Medication Sig Dispense Refill  ? bumetanide (BUMEX) 1 MG tablet Take 1 mg by mouth 2 (two) times daily as needed (as directed).    ? COLLAGEN PO Take 2 Scoops by mouth daily. With coffee    ? cyclobenzaprine (FLEXERIL) 10 MG tablet Take 10 mg by mouth daily as needed for muscle spasms.   11  ? furosemide (LASIX) 20 MG tablet Take 20 mg by mouth 2 (two) times daily as needed for fluid or edema.    ? levothyroxine (SYNTHROID, LEVOTHROID) 112 MCG tablet Take 112 mcg by mouth daily.    ? lisinopril (PRINIVIL,ZESTRIL) 40 MG tablet Take 40 mg by mouth daily.    ? methylPREDNISolone (MEDROL) 4 MG tablet Take 4 mg by mouth as needed.    ? ?No current facility-administered medications for this visit.  ? ? ?Allergies:   Penicillins, Sulfa antibiotics, and Methotrexate  ? ?Social History:  The patient  reports that she has never smoked. She has never used smokeless tobacco. She reports that she does not drink alcohol and does not use drugs.  ? ?Family History:  The patient's family history includes Bone cancer in her father; CAD in her father and  mother; Heart attack in her maternal grandfather and maternal grandmother; Hyperlipidemia in her father and mother; Hypertension in her father and mother; Stroke in her maternal grandmother.  ? ?ROS:  Please see the history of present illness.   Otherwise, review of systems is positive for none.   All other systems are reviewed and negative.  ? ?PHYSICAL EXAM: ?VS:  BP (!) 130/94   Pulse 77   Ht '5\' 7"'$  (1.702 m)   Wt 231 lb 6.4 oz (105 kg)   SpO2 94%    BMI 36.24 kg/m?  , BMI Body mass index is 36.24 kg/m?. ?GEN: Well nourished, well developed, in no acute distress  ?HEENT: normal  ?Neck: no JVD, carotid bruits, or masses ?Cardiac: RRR; no murmurs, rubs, or gallops,no edema  ?Respiratory:  clear to auscultation bilaterally, normal work of breathing ?GI: soft, nontender, nondistended, + BS ?MS: no deformity or atrophy  ?Skin: warm and dry ?Neuro:  Strength and sensation are intact ?Psych: euthymic mood, full affect ? ?EKG:  EKG is ordered today. ?Personal review of the ekg ordered shows sinus rhythm, rate 77 ? ?Recent Labs: ?No results found for requested labs within last 8760 hours.  ? ? ?Lipid Panel  ?No results found for: CHOL, TRIG, HDL, CHOLHDL, VLDL, LDLCALC, LDLDIRECT ? ? ?Wt Readings from Last 3 Encounters:  ?05/21/21 231 lb 6.4 oz (105 kg)  ?11/27/20 245 lb (111.1 kg)  ?08/28/20 243 lb 6.4 oz (110.4 kg)  ?  ? ? ?Other studies Reviewed: ?Additional studies/ records that were reviewed today include: TTE 08/17/19  ?Review of the above records today demonstrates:  ? 1. Left ventricular ejection fraction, by estimation, is 60 to 65%. The  ?left ventricle has normal function. The left ventricle has no regional  ?wall motion abnormalities. There is mild concentric left ventricular  ?hypertrophy. Left ventricular diastolic  ?parameters are indeterminate. The average left ventricular global  ?longitudinal strain is -9.6 %.  ? 2. Right ventricular systolic function is normal. The right ventricular  ?size is normal. There is normal pulmonary artery systolic pressure.  ? 3. The mitral valve is normal in structure. No evidence of mitral valve  ?regurgitation. No evidence of mitral stenosis.  ? 4. The aortic valve is normal in structure. Aortic valve regurgitation is  ?not visualized. No aortic stenosis is present.  ? 5. The inferior vena cava is normal in size with greater than 50%  ?respiratory variability, suggesting right atrial pressure of 3 mmHg. ? ? ?ASSESSMENT AND  PLAN: ? ?1.  Persistent atrial fibrillation: CHA2DS2-VASc of 2.  Status post ablation 05/12/2020.  Eliquis has been stopped post ablation.  She is overall comfortable with her control.  We Yeshua Stryker continue with current management. ? ?2.  Obstructive sleep apnea: CPAP compliance encouraged ? ?3.  Hypertension: Currently well controlled ? ?4.  Morbid obesity: Body mass index is 36.24 kg/m?.  ?Weight loss encouraged ? ? ?Current medicines are reviewed at length with the patient today.   ?The patient does not have concerns regarding her medicines.  The following changes were made today: none ? ?Labs/ tests ordered today include:  ?Orders Placed This Encounter  ?Procedures  ? EKG 12-Lead  ? ? ? ? ? ?Disposition:   FU with Jalea Bronaugh 12 months ? ?Signed, ?Elohim Brune Meredith Leeds, MD  ?05/21/2021 4:27 PM    ? ?[facility] ?[address] ?[address] ?[place] [address] ?[phone] (office) ?[phone] (fax) ?

## 2021-05-21 NOTE — Patient Instructions (Signed)
Medication Instructions:  Your physician recommends that you continue on your current medications as directed. Please refer to the Current Medication list given to you today.  *If you need a refill on your cardiac medications before your next appointment, please call your pharmacy*   Lab Work: None ordered If you have labs (blood work) drawn today and your tests are completely normal, you will receive your results only by: MyChart Message (if you have MyChart) OR A paper copy in the mail If you have any lab test that is abnormal or we need to change your treatment, we will call you to review the results.   Testing/Procedures: None ordered   Follow-Up: At [facility], you and your health needs are our priority.  As part of our continuing mission to provide you with exceptional heart care, we have created designated Provider Care Teams.  These Care Teams include your primary Cardiologist (physician) and Advanced Practice Providers (APPs -  Physician Assistants and Nurse Practitioners) who all work together to provide you with the care you need, when you need it.  Your next appointment:   1 year(s)  The format for your next appointment:   In Person  Provider:   Will Camnitz, MD    Thank you for choosing [facility]!!   Luane Rochon, RN [phone]  Other Instructions   Important Information About Sugar           

## 2022-02-21 ENCOUNTER — Encounter (HOSPITAL_COMMUNITY): Payer: Self-pay | Admitting: *Deleted

## 2022-03-05 IMAGING — DX DG CHEST 2V
2 series · 2 of 2 positions shown · non-contrast
Comparison: None.

CLINICAL DATA: Palpitations

EXAM:
CHEST - 2 VIEW

[chest pa]
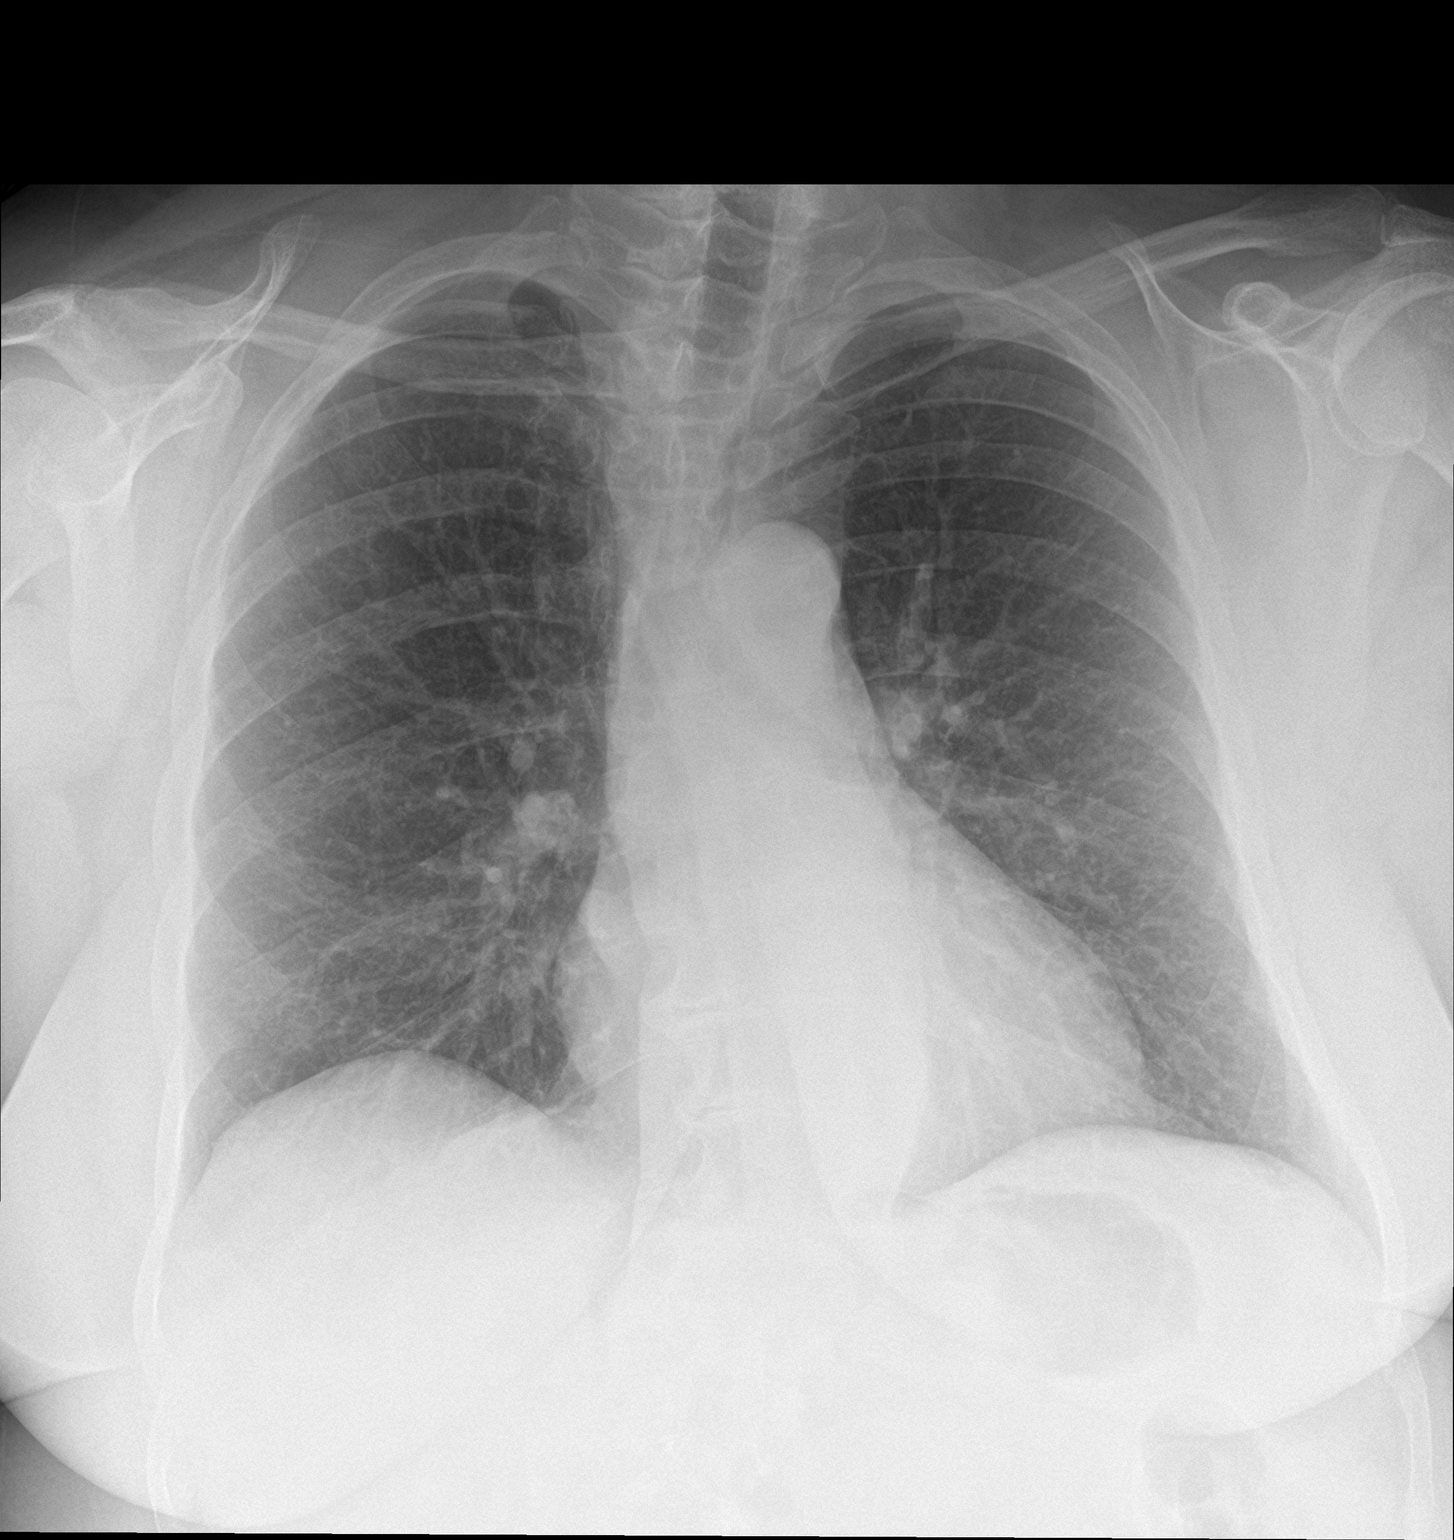

[chest lat]
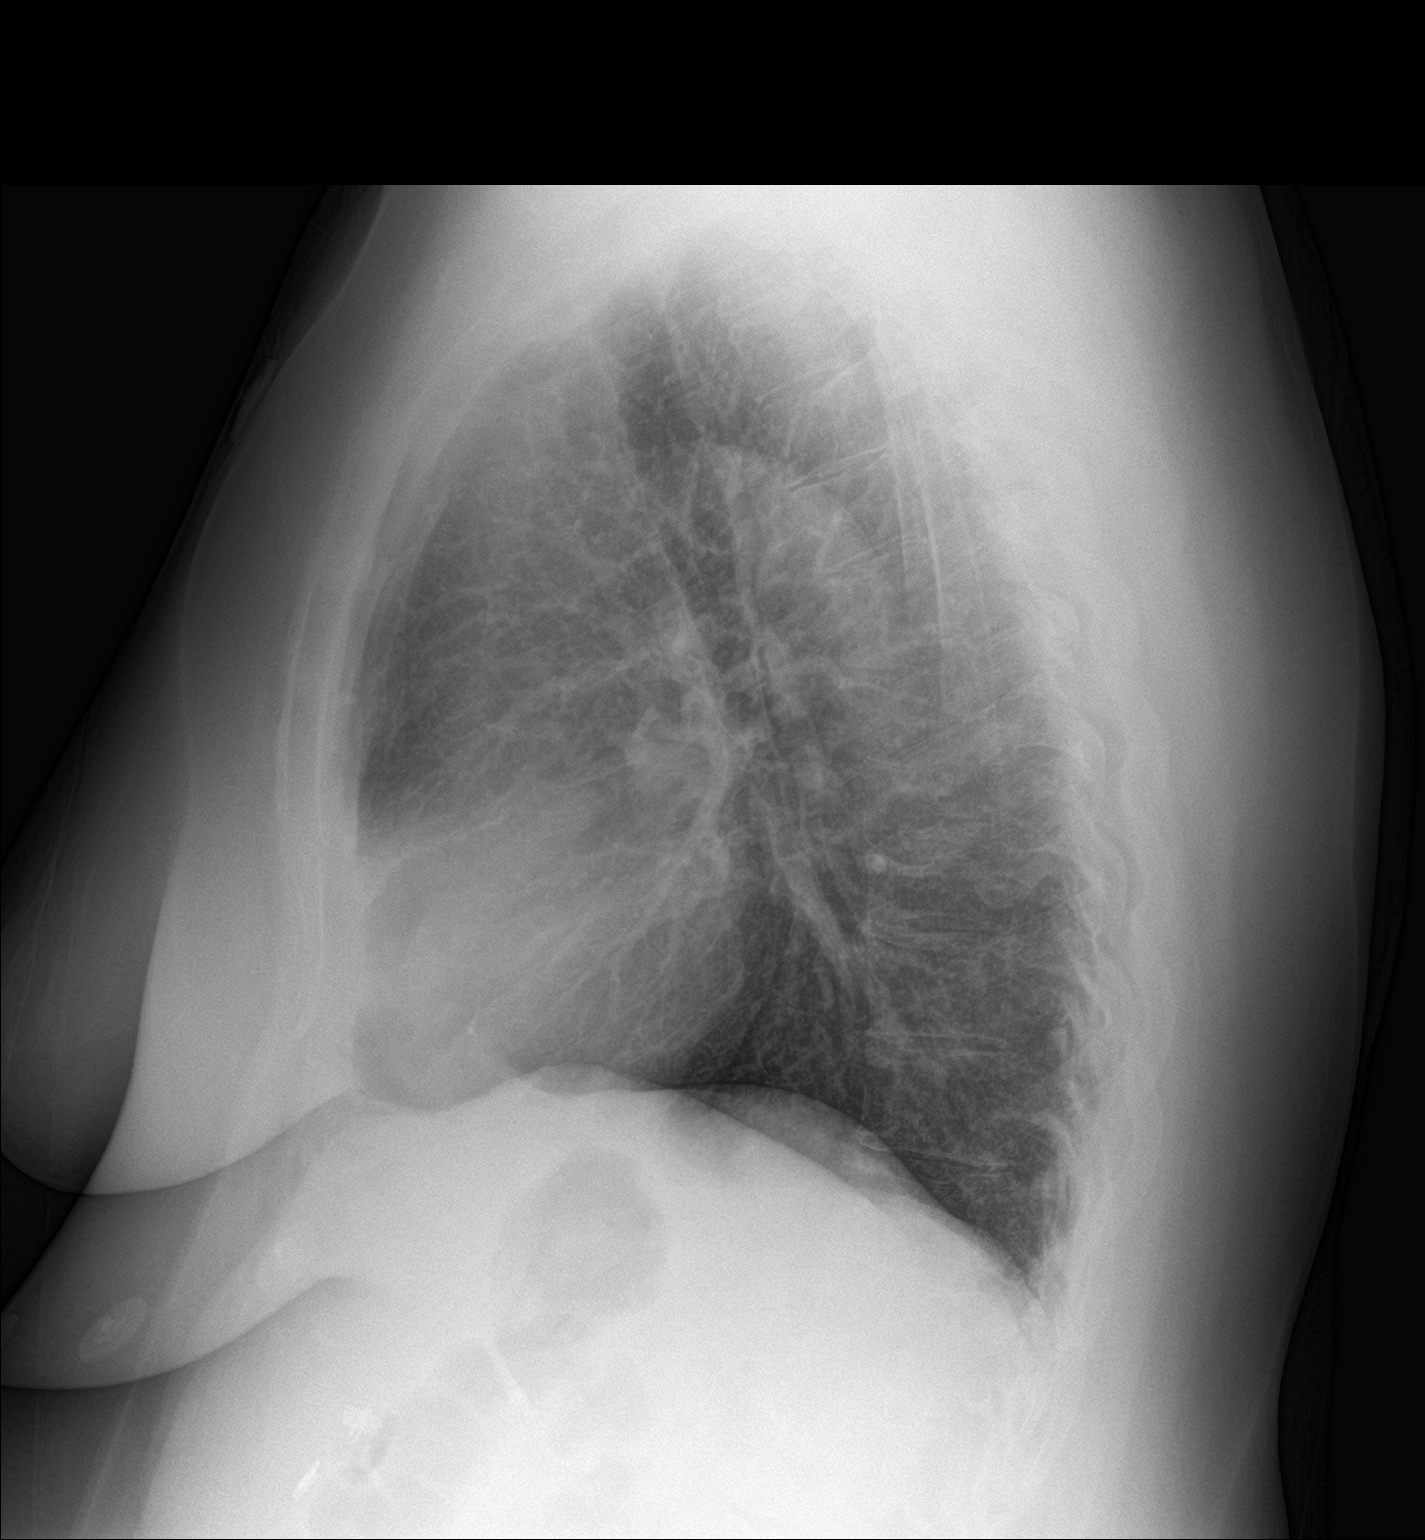

[2 of 2 positions shown; findings below may reference images not displayed]

FINDINGS: The heart size and mediastinal contours are within normal limits.
Both lungs are clear. The visualized skeletal structures are
unremarkable.
IMPRESSION: No active cardiopulmonary disease.

## 2022-03-11 ENCOUNTER — Encounter: Payer: Self-pay | Admitting: Cardiology

## 2022-06-20 NOTE — Progress Notes (Deleted)
Office Visit Note  Patient: Shelby Kirk             Date of Birth: 04/22/1955           MRN: [id number]             PCP: Krystal Clark, NP Referring: Audie Pinto, FNP Visit Date: 06/21/2022 Occupation: @GUAROCC @  Subjective:  No chief complaint on file.   History of Present Illness: Shelby Kirk is a 66 y.o. female ***     Activities of Daily Living:  Patient reports morning stiffness for *** {minute/hour:19697}.   Patient {ACTIONS;DENIES/REPORTS:21021675::"Denies"} nocturnal pain.  Difficulty dressing/grooming: {ACTIONS;DENIES/REPORTS:21021675::"Denies"} Difficulty climbing stairs: {ACTIONS;DENIES/REPORTS:21021675::"Denies"} Difficulty getting out of chair: {ACTIONS;DENIES/REPORTS:21021675::"Denies"} Difficulty using hands for taps, buttons, cutlery, and/or writing: {ACTIONS;DENIES/REPORTS:21021675::"Denies"}  No Rheumatology ROS completed.   PMFS History:  Patient Active Problem List   Diagnosis Date Noted   Malaise and fatigue 05/06/2019   Menopausal flushing 05/06/2019   Rheumatoid arthritis ([facility]) 09/23/2016   Fatigue 09/23/2016   Murmur, heart 09/23/2016   Adult situational stress disorder 04/26/2016   Essential hypertension 04/26/2016   Screening cholesterol level 04/26/2016   Depression 10/14/2015   Hypothyroidism 10/14/2015   MVP (mitral valve prolapse) 10/14/2015   OSA (obstructive sleep apnea) 10/14/2015   Chronic pain of multiple joints 10/13/2015   Palpitation 10/02/2014   Benign neoplasm of choroid 05/27/2011    Past Medical History:  Diagnosis Date   Adult situational stress disorder 04/26/2016   Benign neoplasm of choroid 05/27/2011   Chronic pain of multiple joints 10/13/2015   Depression 10/14/2015   Essential hypertension 04/26/2016   Fatigue 09/23/2016   Hypothyroidism 10/14/2015   Malaise and fatigue 05/06/2019   Menopausal flushing 05/06/2019   Murmur, heart 09/23/2016   MVP (mitral valve prolapse) 10/14/2015   OSA  (obstructive sleep apnea) 10/14/2015   Palpitation 10/02/2014   Rheumatoid arthritis (HCC) 09/23/2016   Screening cholesterol level 04/26/2016    Family History  Problem Relation Age of Onset   Hypertension Mother    Hyperlipidemia Mother    CAD Mother    Hypertension Father    Hyperlipidemia Father    Bone cancer Father    CAD Father    Stroke Maternal Grandmother    Heart attack Maternal Grandmother    Heart attack Maternal Grandfather    Past Surgical History:  Procedure Laterality Date   ATRIAL FIBRILLATION ABLATION N/A 05/12/2020   Procedure: ATRIAL FIBRILLATION ABLATION;  Surgeon: Regan Lemming, MD;  Location: [facility];  Service: Cardiovascular;  Laterality: N/A;   BACK SURGERY     CESAREAN SECTION     CHOLECYSTECTOMY     Social History   Social History Narrative   Not on file   Immunization History  Administered Date(s) Administered   Influenza-Unspecified 11/15/2015, 10/14/2017   Tdap 04/26/2016     Objective: Vital Signs: There were no vitals taken for this visit.   Physical Exam   Musculoskeletal Exam: ***  CDAI Exam: CDAI Score: -- Patient Global: --; Provider Global: -- Swollen: --; Tender: -- Joint Exam 06/21/2022   No joint exam has been documented for this visit   There is currently no information documented on the homunculus. Go to the Rheumatology activity and complete the homunculus joint exam.  Investigation: No additional findings.  Imaging: No results found.  Recent Labs: Lab Results  Component Value Date   WBC 7.7 05/03/2020   HGB 15.9 05/03/2020   PLT 214 05/03/2020   NA  142 05/03/2020   K 4.1 05/03/2020   CL 103 05/03/2020   CO2 20 05/03/2020   GLUCOSE 86 05/03/2020   BUN 13 05/03/2020   CREATININE 0.74 05/03/2020   CALCIUM 9.4 05/03/2020   GFRAA >60 07/26/2019    Speciality Comments: No specialty comments available.  Procedures:  No procedures performed Allergies: Penicillins, Sulfa antibiotics, and  Methotrexate   Assessment / Plan:     Visit Diagnoses: No diagnosis found.  Orders: No orders of the defined types were placed in this encounter.  No orders of the defined types were placed in this encounter.   Face-to-face time spent with patient was *** minutes. Greater than 50% of time was spent in counseling and coordination of care.  Follow-Up Instructions: No follow-ups on file.   Fuller Plan, MD  Note - This record has been created using [employer].  Chart creation errors have been sought, but may not always  have been located. Such creation errors do not reflect on  the standard of medical care.

## 2022-06-21 ENCOUNTER — Encounter: Payer: 59 | Admitting: Internal Medicine

## 2022-12-17 IMAGING — CT CT HEART MORPH/PULM VEIN W/ CM & W/O CA SCORE
3 of 5 series · 14 of 20 positions shown, 15 images · non-contrast
Comparison: None.
COMPARISON: None.

Addendum:
EXAM:
OVER-READ INTERPRETATION  CT CHEST

The following report is an over-read performed by radiologist Dr.
Giusepoe Abreu [REDACTED] on 05/08/2020. This
over-read does not include interpretation of cardiac or coronary
anatomy or pathology. The coronary calcium score/coronary CTA
interpretation by the cardiologist is attached.
CLINICAL DATA: Atrial fibrillation scheduled for ablation.
Cardiac CTA
TECHNIQUE: A non-contrast, gated CT scan was obtained with axial slices of 3 mm
through the heart for calcium scoring. Calcium scoring was performed
using the Agatston method. A 130 kV prospective, gated, contrast
cardiac scan was obtained. Gantry rotation speed was 250 msecs and
collimation was 0.6 mm. Nitroglycerin was not given. A delayed scan
was obtained to exclude left atrial appendage thrombus. The 3D
dataset was reconstructed in 5% intervals of the 20-50% of the R-R
cycle. Late systolic phases were analyzed on a dedicated workstation
using MPR, MIP, and VRT modes. The patient received 80 cc of
contrast.

[Series 3: cascseq (id) · axial · 0.39mm/px · z∈[-318,-172]mm · 3 of 74 slices shown, 4 images]
[im 1/74  vessel]
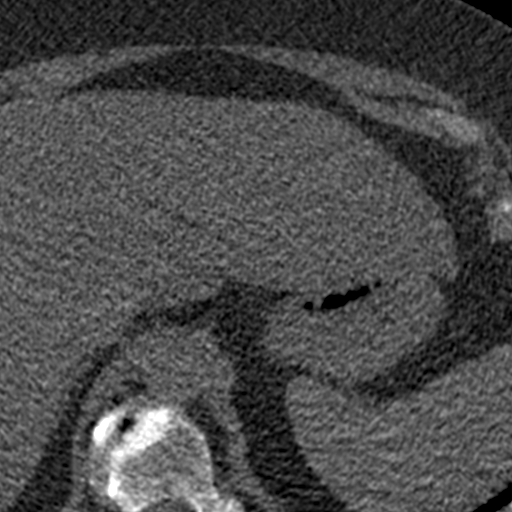
[im 1/74  lung]
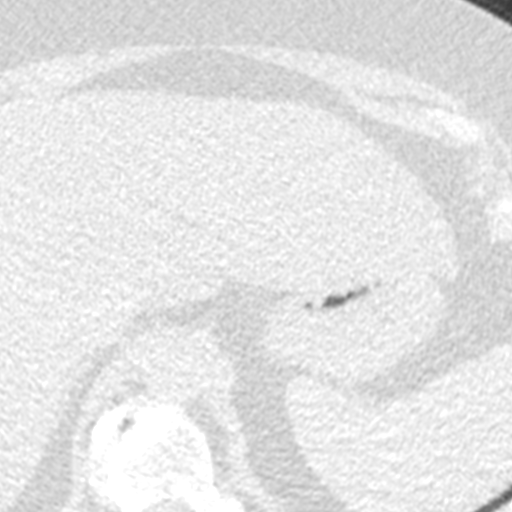
[im 37/74  vessel]
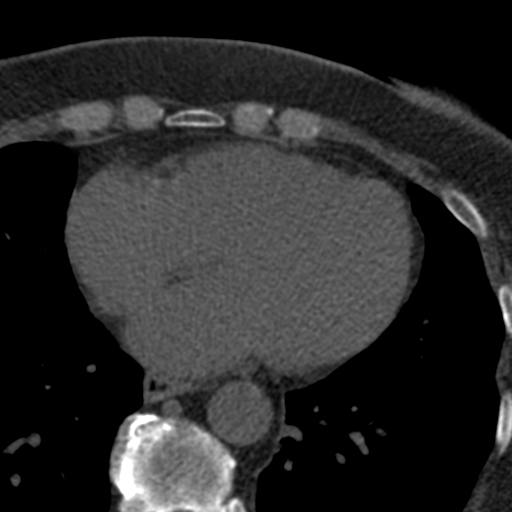
[im 74/74  vessel]
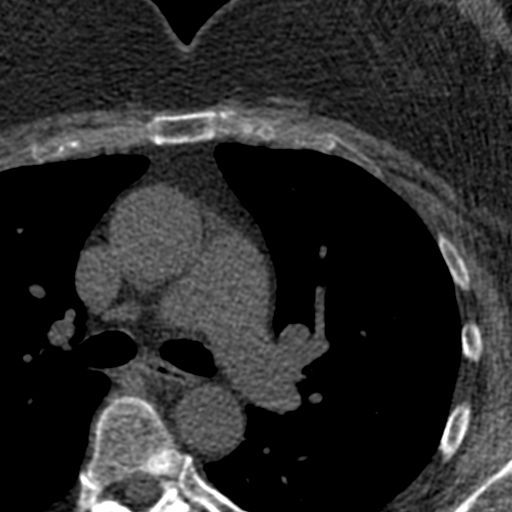

[Series 6: best syst · axial · 0.39mm/px · z∈[-285,-186]mm · 8 of 318 slices shown]
[im 36/318  vessel]
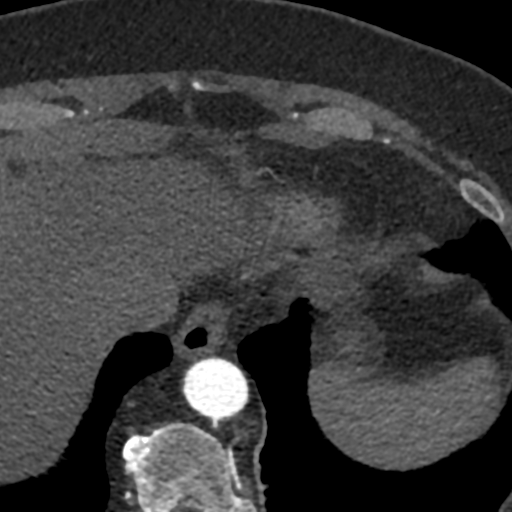
[im 71/318  vessel]
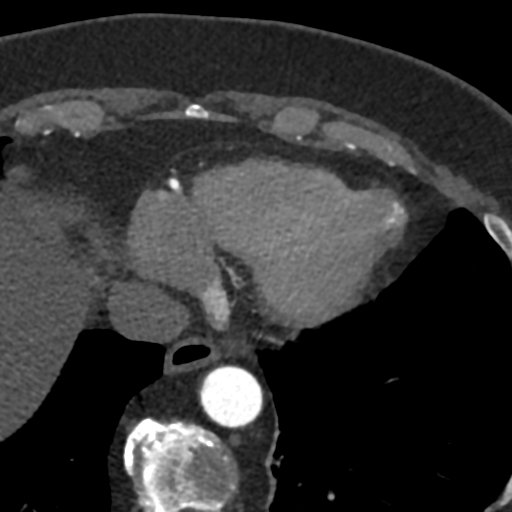
[im 106/318  vessel]
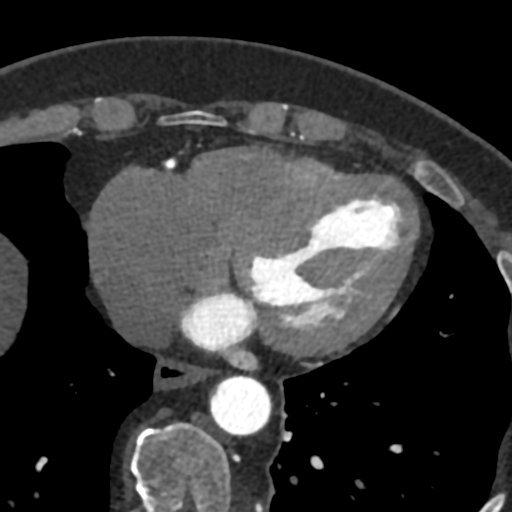
[im 141/318  vessel]
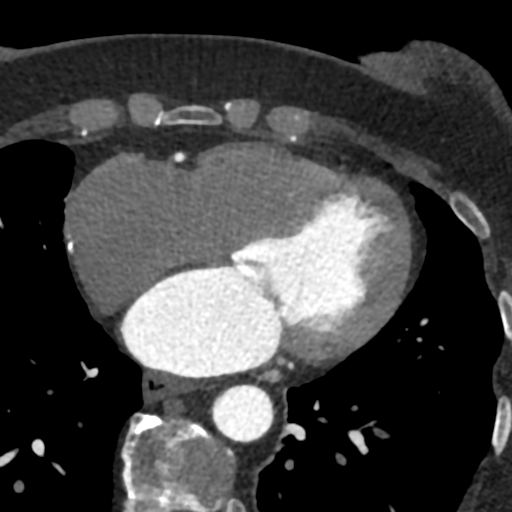
[im 177/318  vessel]
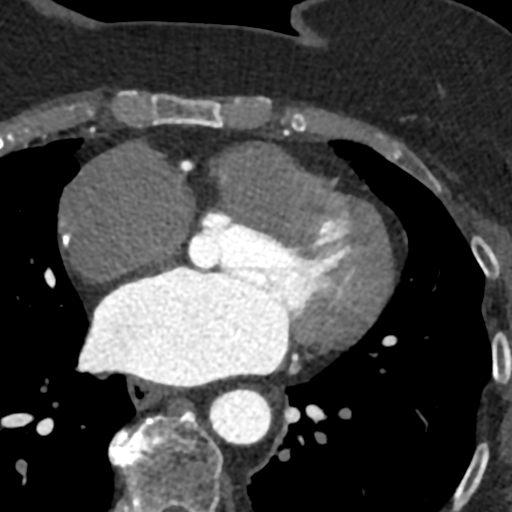
[im 212/318  vessel]
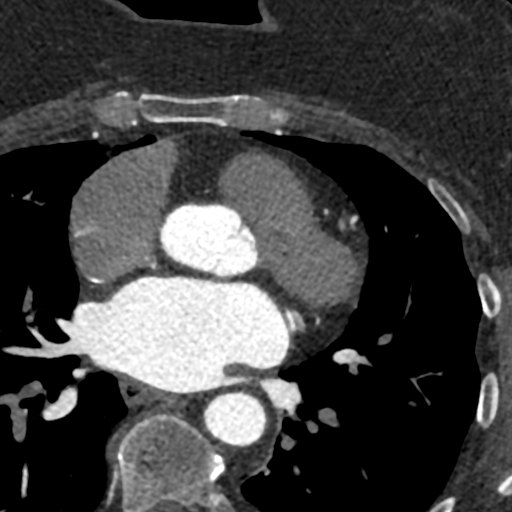
[im 247/318  vessel]
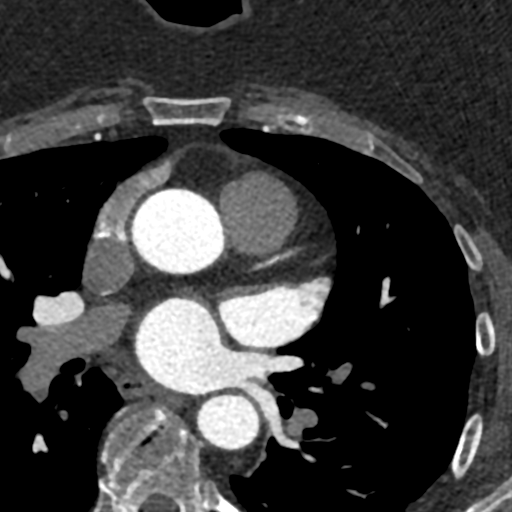
[im 282/318  vessel]
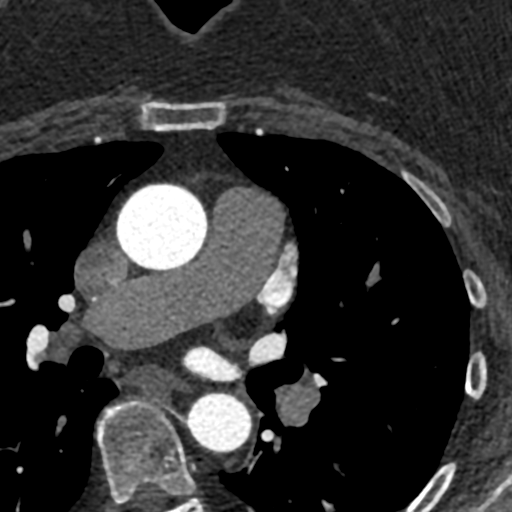

[Series 13: pv delay · axial · portal-venous · 0.39mm/px · z∈[-240,-194]mm · 3 of 180 slices shown]
[im 45/180  vessel]
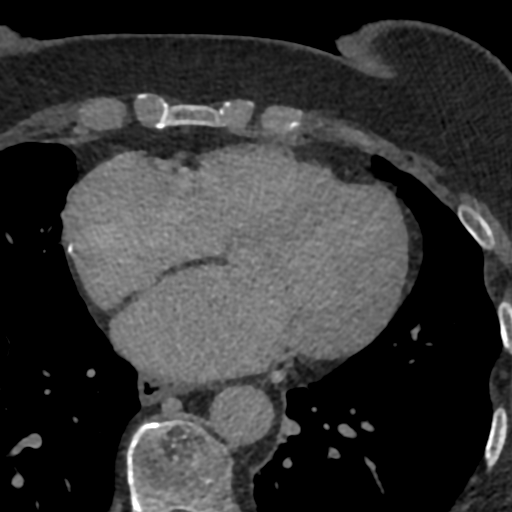
[im 90/180  vessel]
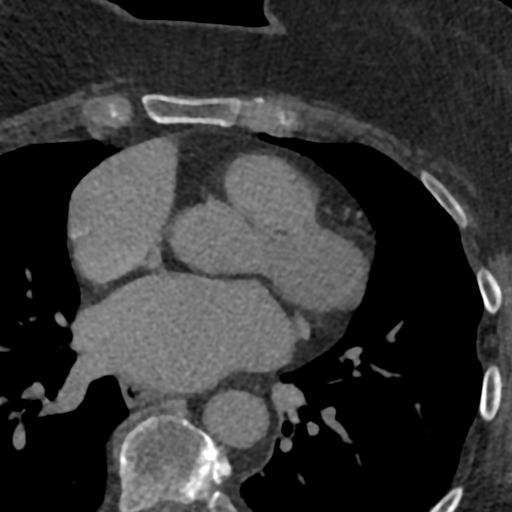
[im 135/180  vessel]
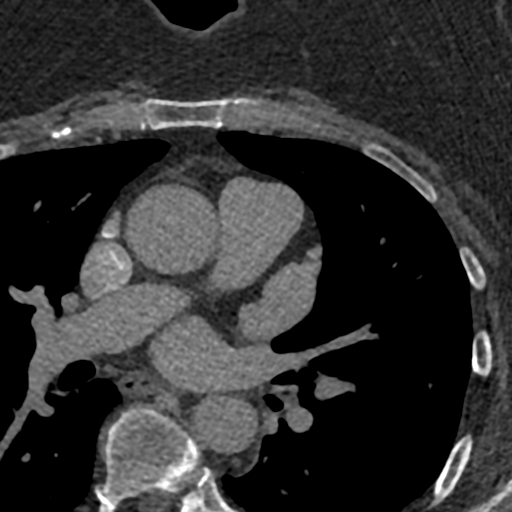

[14 of 20 positions shown; findings below may reference images not displayed]

FINDINGS: Within the visualized portions of the thorax there are no suspicious
appearing pulmonary nodules or masses, there is no acute
consolidative airspace disease, no pleural effusions, no
pneumothorax and no lymphadenopathy. Visualized portions of the
upper abdomen demonstrates diffuse low attenuation throughout the
visualized hepatic parenchyma, indicative of hepatic steatosis.
There are no aggressive appearing lytic or blastic lesions noted in
the visualized portions of the skeleton.
IMPRESSION: 1. Hepatic steatosis.
FINDINGS: Image quality: Excellent.

Noise artifact is: Mild cardiac motion artifact.

Pulmonary Veins: There is normal pulmonary vein drainage into the
left atrium (2 on the right and 2 on the left) with ostial
measurements as follows:

RUPV: Ostium 21.6 mm x 16 mm  area 268 mm2

RLPV:  Ostium 13 mm x 12 mm  area 126 mm2

LUPV:  Ostium 19 mm x 15.5 mm  area 216 mm2

LLPV:  Ostium 15 mm x 9 mm  area 99 mm2

Left Atrium: The left atrial size is dilated. There is no PFO/ASD.
The left atrial appendage is large broccoli type with two lobes and
ostial size 31.1 mm x 20.2 mm and length 32.1 mm. There is no
thrombus in the left atrial appendage on contrast or delayed
imaging. The esophagus runs in the left atrial midline and is not in
proximity to any of the pulmonary vein ostia.

Coronary Arteries: CAC score of 0. Normal coronary origin. Right
dominance. The study was performed without use of NTG and is
insufficient for plaque evaluation.

Right Atrium: Right atrial size is dilated.

Right Ventricle: The right ventricular cavity is within normal
limits.

Left Ventricle: The ventricular cavity size is within normal limits.
There are no stigmata of prior infarction. There is no abnormal
filling defect.

Pericardium: Normal thickness with no significant effusion or
calcium present.

Pulmonary Artery: Normal caliber without proximal filling defect.

Cardiac valves: The aortic valve is trileaflet without significant
calcification. The mitral valve is normal structure without
significant calcification.

Aorta: Normal caliber with no significant disease.

Extra-cardiac findings: See attached radiology report for
non-cardiac structures.
IMPRESSION: 1. There is normal pulmonary vein drainage into the left atrium with
ostial measurements above.

2. There is no thrombus in the left atrial appendage.

3. The esophagus runs in the left atrial midline and is not in
proximity to any of the pulmonary vein ostia.

4. No PFO/ASD.

5. Normal coronary origin. Right dominance.

6. CAC score of 0.

*** End of Addendum ***
EXAM:
OVER-READ INTERPRETATION  CT CHEST

The following report is an over-read performed by radiologist Dr.
Giusepoe Abreu [REDACTED] on 05/08/2020. This
over-read does not include interpretation of cardiac or coronary
anatomy or pathology. The coronary calcium score/coronary CTA
interpretation by the cardiologist is attached.
FINDINGS: Within the visualized portions of the thorax there are no suspicious
appearing pulmonary nodules or masses, there is no acute
consolidative airspace disease, no pleural effusions, no
pneumothorax and no lymphadenopathy. Visualized portions of the
upper abdomen demonstrates diffuse low attenuation throughout the
visualized hepatic parenchyma, indicative of hepatic steatosis.
There are no aggressive appearing lytic or blastic lesions noted in
the visualized portions of the skeleton.
IMPRESSION: 1. Hepatic steatosis.

## 2023-01-20 ENCOUNTER — Ambulatory Visit: Payer: 59 | Admitting: Cardiology

## 2023-02-17 ENCOUNTER — Ambulatory Visit: Payer: Self-pay | Admitting: Physician Assistant
